# Patient Record
Sex: Female | Born: 1995 | Race: White | Hispanic: No | Marital: Single | Smoking: Never smoker
Health system: Southern US, Community
[De-identification: ages and names within clinical notes are randomized; demographics above are authoritative.]

## PROBLEM LIST (undated history)

## (undated) DIAGNOSIS — L709 Acne, unspecified: Secondary | ICD-10-CM

## (undated) DIAGNOSIS — N870 Mild cervical dysplasia: Secondary | ICD-10-CM

## (undated) DIAGNOSIS — N912 Amenorrhea, unspecified: Secondary | ICD-10-CM

## (undated) DIAGNOSIS — A64 Unspecified sexually transmitted disease: Secondary | ICD-10-CM

## (undated) DIAGNOSIS — R87612 Low grade squamous intraepithelial lesion on cytologic smear of cervix (LGSIL): Secondary | ICD-10-CM

## (undated) DIAGNOSIS — N890 Mild vaginal dysplasia: Secondary | ICD-10-CM

## (undated) DIAGNOSIS — N946 Dysmenorrhea, unspecified: Secondary | ICD-10-CM

## (undated) DIAGNOSIS — E782 Mixed hyperlipidemia: Secondary | ICD-10-CM

## (undated) HISTORY — DX: Mixed hyperlipidemia: E78.2

## (undated) HISTORY — DX: Amenorrhea, unspecified: N91.2

## (undated) HISTORY — DX: Mild cervical dysplasia: N87.0

## (undated) HISTORY — DX: Mild vaginal dysplasia: N89.0

## (undated) HISTORY — DX: Acne, unspecified: L70.9

## (undated) HISTORY — PX: WISDOM TOOTH EXTRACTION: SHX21

## (undated) HISTORY — DX: Unspecified sexually transmitted disease: A64

## (undated) HISTORY — DX: Dysmenorrhea, unspecified: N94.6

## (undated) HISTORY — DX: Low grade squamous intraepithelial lesion on cytologic smear of cervix (LGSIL): R87.612

---
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
## 2014-01-03 ENCOUNTER — Encounter: Payer: Self-pay | Admitting: Nurse Practitioner

## 2014-01-03 ENCOUNTER — Ambulatory Visit (INDEPENDENT_AMBULATORY_CARE_PROVIDER_SITE_OTHER): Payer: BC Managed Care – PPO | Admitting: Obstetrics and Gynecology

## 2014-01-03 ENCOUNTER — Encounter: Payer: Self-pay | Admitting: Obstetrics and Gynecology

## 2014-01-03 VITALS — BP 105/72 | HR 93 | Resp 16 | Ht 63.5 in | Wt 121.0 lb

## 2014-01-03 DIAGNOSIS — Z0000 Encounter for general adult medical examination without abnormal findings: Secondary | ICD-10-CM

## 2014-01-03 DIAGNOSIS — N926 Irregular menstruation, unspecified: Secondary | ICD-10-CM

## 2014-01-03 DIAGNOSIS — Z01419 Encounter for gynecological examination (general) (routine) without abnormal findings: Secondary | ICD-10-CM

## 2014-01-03 LAB — POCT URINALYSIS DIPSTICK
Bilirubin, UA: NEGATIVE
Blood, UA: NEGATIVE
Glucose, UA: NEGATIVE
KETONES UA: NEGATIVE
Leukocytes, UA: NEGATIVE
Nitrite, UA: NEGATIVE
PROTEIN UA: NEGATIVE
UROBILINOGEN UA: NEGATIVE
pH, UA: 6

## 2014-01-03 LAB — FSH/LH
FSH: 8 m[IU]/mL
LH: 7.5 m[IU]/mL

## 2014-01-03 LAB — HEMOGLOBIN, FINGERSTICK: HEMOGLOBIN, FINGERSTICK: 14.5 g/dL (ref 12.0–16.0)

## 2014-01-03 LAB — TESTOSTERONE: TESTOSTERONE: 81 ng/dL — AB (ref 15–40)

## 2014-01-03 LAB — PROLACTIN: PROLACTIN: 9.3 ng/mL

## 2014-01-03 LAB — TSH: TSH: 1.788 u[IU]/mL (ref 0.350–4.500)

## 2014-01-03 LAB — ESTRADIOL: Estradiol: 52.9 pg/mL

## 2014-01-03 MED ORDER — ETONOGESTREL-ETHINYL ESTRADIOL 0.12-0.015 MG/24HR VA RING
1.0000 | VAGINAL_RING | VAGINAL | Status: DC
Start: 2014-01-03 — End: 2014-04-04

## 2014-01-03 NOTE — Patient Instructions (Signed)

## 2014-01-03 NOTE — Progress Notes (Signed)
GYNECOLOGY VISIT  PCP: Dr. Winona LegatoKiefer  Referring provider:   HPI: 18 y.o.   Single  Caucasian  female   G0P0000 with Patient's last menstrual period was 12/25/2013.   here for   NGYN/  Irregular Menst Cycles Started birth control a couple of years ago for acne - from Dermatologist. Off for 4 -5 months.  Menses now longer, longest lasting 3 weeks. Having cramping, which is increasing. Takes ibuprofen and Midol which help help. Irregular menses the second year she was on the pills.  Occasional late start. Some midline chest hair that she bleaches, but does not remove.  No facial hair.  No headaches or nipple discharge.  Seeing Derm still and they are prescribing a local therapy.    Declines STD testing.  Not sexually active.   Hgb:  14.5 Urine:  neg  GYNECOLOGIC HISTORY: Patient's last menstrual period was 12/25/2013. Sexually active:  no Partner preference: female Contraception:   none Menopausal hormone therapy: no DES exposure:  no  Blood transfusions:  no  Sexually transmitted diseases:   no GYN Procedures:  no:                 Pap:   First time GYN visit History of abnormal pap smear:  no   OB History   Grav Para Term Preterm Abortions TAB SAB Ect Mult Living   0 0 0 0 0 0 0 0 0 0        LIFESTYLE: Exercise:        Rock wall climbing  - [employer]. Tobacco: no Alcohol:no Drug use:  no  OTHER HEALTH MAINTENANCE: Tetanus/TDap:2011 Gardisil: all three gardasils done (2010) Influenza:  no Zostavax:no  Bone density: no Colonoscopy: no  Cholesterol check: no  Family History  Problem Relation Age of Onset  . Diabetes Father   . Diabetes Paternal Grandmother     There are no active problems to display for this patient.  Past Medical History  Diagnosis Date  . Amenorrhea   . Dysmenorrhea     Past Surgical History  Procedure Laterality Date  . Wisdom tooth extraction      ALLERGIES: Review of patient's allergies indicates no known allergies.  Current  Outpatient Prescriptions  Medication Sig Dispense Refill  . Fexofenadine HCl (ALLEGRA PO) Take by mouth as needed.      . IBUPROFEN PO Take by mouth as needed.       No current facility-administered medications for this visit.     ROS:  Pertinent items are noted in HPI.  SOCIAL HISTORY:  Consulting civil engineertudent in international studies.  Will study abroad in [place] next year.   PHYSICAL EXAMINATION:    BP 105/72  Pulse 93  Resp 16  Ht 5' 3.5" (1.613 m)  Wt 121 lb (54.885 kg)  BMI 21.10 kg/m2  LMP 12/25/2013   Wt Readings from Last 3 Encounters:  01/03/14 121 lb (54.885 kg) (36%*, Z = -0.37)   * Growth percentiles are based on CDC 2-20 Years data.     Ht Readings from Last 3 Encounters:  01/03/14 5' 3.5" (1.613 m) (38%*, Z = -0.31)   * Growth percentiles are based on CDC 2-20 Years data.    General appearance: alert, cooperative and appears stated age Head: Normocephalic, without obvious abnormality, atraumatic Neck: no adenopathy, supple, symmetrical, trachea midline and thyroid not enlarged, symmetric, no tenderness/mass/nodules Lungs: clear to auscultation bilaterally Breasts: Inspection negative, No nipple retraction or dimpling, No nipple discharge or bleeding, No axillary  or supraclavicular adenopathy, Normal to palpation without dominant masses Heart: regular rate and rhythm Abdomen: soft, non-tender; no masses,  no organomegaly Extremities: extremities normal, atraumatic, no cyanosis or edema Skin: Skin color, texture, turgor normal. No rashes or lesions.  Small amount of midline chest hair.  Lymph nodes: Cervical, supraclavicular, and axillary nodes normal. No abnormal inguinal nodes palpated Neurologic: Grossly normal  Pelvic: External genitalia:  no lesions              Urethra:  normal appearing urethra with no masses, tenderness or lesions              Bartholins and Skenes: normal                 Vagina: normal appearing vagina with normal color and discharge, no  lesions              Cervix: normal appearance              Pap and high risk HPV testing done: no.            Bimanual Exam:  Uterus:  uterus is normal size, shape, consistency and nontender                                      Adnexa: normal adnexa in size, nontender and no masses                                       ASSESSMENT  Normal gynecologic exam. Irregular menses.  Probable anovulation.   PLAN  Mammogram Pap smear and high risk HPV testing not indicated. Declines STD check. Will check TSH, prolactin, LH, FSH, testosterone, estradiol. Nuva Ring Rx for 12 months.  Explained proper use, benefits and risks. Medications per Epic orders Follow up in three months.  Return annually or prn   An After Visit Summary was printed and given to the patient.

## 2014-01-21 ENCOUNTER — Ambulatory Visit (INDEPENDENT_AMBULATORY_CARE_PROVIDER_SITE_OTHER): Payer: BC Managed Care – PPO | Admitting: Obstetrics and Gynecology

## 2014-01-21 ENCOUNTER — Encounter: Payer: Self-pay | Admitting: Obstetrics and Gynecology

## 2014-01-21 VITALS — BP 105/63 | HR 76 | Resp 16 | Wt 121.0 lb

## 2014-01-21 DIAGNOSIS — E282 Polycystic ovarian syndrome: Secondary | ICD-10-CM

## 2014-01-21 LAB — COMPREHENSIVE METABOLIC PANEL
ALT: 11 U/L (ref 0–35)
AST: 14 U/L (ref 0–37)
Albumin: 4.6 g/dL (ref 3.5–5.2)
Alkaline Phosphatase: 57 U/L (ref 39–117)
BILIRUBIN TOTAL: 0.5 mg/dL (ref 0.2–1.1)
BUN: 9 mg/dL (ref 6–23)
CO2: 28 mEq/L (ref 19–32)
CREATININE: 0.69 mg/dL (ref 0.50–1.10)
Calcium: 9.6 mg/dL (ref 8.4–10.5)
Chloride: 103 mEq/L (ref 96–112)
GLUCOSE: 68 mg/dL — AB (ref 70–99)
Potassium: 4.1 mEq/L (ref 3.5–5.3)
Sodium: 141 mEq/L (ref 135–145)
Total Protein: 6.7 g/dL (ref 6.0–8.3)

## 2014-01-21 LAB — LIPID PANEL
Cholesterol: 150 mg/dL (ref 0–200)
HDL: 52 mg/dL (ref >39)
LDL CALC: 84 mg/dL (ref 0–99)
Total CHOL/HDL Ratio: 2.9 Ratio
Triglycerides: 72 mg/dL (ref <150)
VLDL: 14 mg/dL (ref 0–40)

## 2014-01-21 NOTE — Progress Notes (Signed)
Patient ID: Linda PillarMyla L Spencer, female   DOB: [birth year], 18 y.o.   MRN: [id number]  Subjective  18 y.o. Single Caucasian female  810P0000 female here for discussion of elevated testosterone level noted on lab exams for irregular menses and hirsutism. Reporting some midline chest hair and no facial hair.    Has acne and has been under treatment of dermatologist.  On NuvaRing now.  Objective  Patient presents with her mother.  Labs from 01/03/14 -  Total testosterone - 81 FSH 8.0 LH 7.5 TSH 1.788 Estradiol 52.9 Prolactin 9.3  Assessment  Likely PCOS.  Plan  Discussion with patient and mother regarding PCOS, pathophysiology, clinical picture, relationship to glucose intolerance/hyperlipidemia/endometrial hyperplasia, and treatment options.  We discussed benefits of combined contraception, spironolactone, and medications such as clomid or letrazol for ovulation induction if necessary at the time of fertility planning.  We reviewed methods of hair removal if needed such as electrolysis and laser hair removal.    Patient will treat with NuvaRing at this time.  Will do a lipid profile and complete metabolic profile today.  Questions invited and answered.  Patient will follow up in 2 - 3 months for a recheck.  25 minutes face to face time of which over 50% was spent in counseling.   After visit summary to patient.

## 2014-01-21 NOTE — Patient Instructions (Signed)
Polycystic Ovarian Syndrome Polycystic ovarian syndrome (PCOS) is a common hormonal disorder among women of reproductive age. Most women with PCOS grow many small cysts on their ovaries. PCOS can cause problems with your periods and make it difficult to get pregnant. It can also cause an increased risk of miscarriage with pregnancy. If left untreated, PCOS can lead to serious health problems, such as diabetes and heart disease. CAUSES The cause of PCOS is not fully understood, but genetics may be a factor. SIGNS AND SYMPTOMS   Infrequent or no menstrual periods.   Inability to get pregnant (infertility) because of not ovulating.   Increased growth of hair on the face, chest, stomach, back, thumbs, thighs, or toes.   Acne, oily skin, or dandruff.   Pelvic pain.   Weight gain or obesity, usually carrying extra weight around the waist.   Type 2 diabetes.   High cholesterol.   High blood pressure.   Female-pattern baldness or thinning hair.   Patches of thickened and dark brown or black skin on the neck, arms, breasts, or thighs.   Tiny excess flaps of skin (skin tags) in the armpits or neck area.   Excessive snoring and having breathing stop at times while asleep (sleep apnea).   Deepening of the voice.   Gestational diabetes when pregnant.  DIAGNOSIS  There is no single test to diagnose PCOS.   Your health care provider will:   Take a medical history.   Perform a pelvic exam.   Have ultrasonography done.   Check your female and female hormone levels.   Measure glucose or sugar levels in the blood.   Do other blood tests.   If you are producing too many female hormones, your health care provider will make sure it is from PCOS. At the physical exam, your health care provider will want to evaluate the areas of increased hair growth. Try to allow natural hair growth for a few days before the visit.   During a pelvic exam, the ovaries may be enlarged  or swollen because of the increased number of small cysts. This can be seen more easily by using vaginal ultrasonography or screening to examine the ovaries and lining of the uterus (endometrium) for cysts. The uterine lining may become thicker if you have not been having a regular period.  TREATMENT  Because there is no cure for PCOS, it needs to be managed to prevent problems. Treatments are based on your symptoms. Treatment is also based on whether you want to have a baby or whether you need contraception.  Treatment may include:   Progesterone hormone to start a menstrual period.   Birth control pills to make you have regular menstrual periods.   Medicines to make you ovulate, if you want to get pregnant.   Medicines to control your insulin.   Medicine to control your blood pressure.   Medicine and diet to control your high cholesterol and triglycerides in your blood.  Medicine to reduce excessive hair growth.  Surgery, making small holes in the ovary, to decrease the amount of female hormone production. This is done through a long, lighted tube (laparoscope) placed into the pelvis through a tiny incision in the lower abdomen.  HOME CARE INSTRUCTIONS  Only take over-the-counter or prescription medicine as directed by your health care provider.  Pay attention to the foods you eat and your activity levels. This can help reduce the effects of PCOS.  Keep your weight under control.  Eat foods that are   low in carbohydrate and high in fiber.  Exercise regularly. SEEK MEDICAL CARE IF:  Your symptoms do not get better with medicine.  You have new symptoms. Document Released: 04/01/2005 Document Revised: 09/26/2013 Document Reviewed: 05/24/2013 ExitCare Patient Information 2014 [employer].  

## 2014-01-22 ENCOUNTER — Encounter: Payer: Self-pay | Admitting: Obstetrics and Gynecology

## 2014-04-04 ENCOUNTER — Encounter: Payer: Self-pay | Admitting: Obstetrics and Gynecology

## 2014-04-04 ENCOUNTER — Ambulatory Visit (INDEPENDENT_AMBULATORY_CARE_PROVIDER_SITE_OTHER): Payer: BC Managed Care – PPO | Admitting: Obstetrics and Gynecology

## 2014-04-04 VITALS — BP 100/60 | HR 60 | Ht 63.5 in | Wt 117.0 lb

## 2014-04-04 DIAGNOSIS — Z3049 Encounter for surveillance of other contraceptives: Secondary | ICD-10-CM

## 2014-04-04 MED ORDER — ETONOGESTREL-ETHINYL ESTRADIOL 0.12-0.015 MG/24HR VA RING: 1.0000 | VAGINAL_RING | VAGINAL | Status: DC

## 2014-04-04 NOTE — Progress Notes (Signed)
Patient ID: Linda PillarMyla L Spencer, female   DOB: Dec 06, 1996, 18 y.o.   MRN: [id number] GYNECOLOGY  VISIT   HPI: 18 y.o.   Single  Caucasian  female   G0P0000 with LMP 03-11-14. here for  2 month follow up since beginning Nuvaring. Menses regular.  Asking about NuvaRing and risks of clots seen in the media and on Facebook. Never sexually active.   Going to study abroad for one year, leaving in July 2015.   GYNECOLOGIC HISTORY: LMP:03-11-14 Contraception:  Nuvaring Menopausal hormone therapy: n/a        OB History   Grav Para Term Preterm Abortions TAB SAB Ect Mult Living   0 0 0 0 0 0 0 0 0 0          There are no active problems to display for this patient.   Past Medical History  Diagnosis Date  . Amenorrhea   . Dysmenorrhea   . Acne     Past Surgical History  Procedure Laterality Date  . Wisdom tooth extraction      Current Outpatient Prescriptions  Medication Sig Dispense Refill  . etonogestrel-ethinyl estradiol (NUVARING) 0.12-0.015 MG/24HR vaginal ring Place 1 each vaginally every 28 (twenty-eight) days. Insert vaginally and leave in place for 3 consecutive weeks, then remove for 1 week.  1 each  11  . Fexofenadine HCl (ALLEGRA PO) Take by mouth as needed.      . IBUPROFEN PO Take by mouth as needed.       No current facility-administered medications for this visit.     ALLERGIES: Review of patient's allergies indicates no known allergies.  Family History  Problem Relation Age of Onset  . Diabetes Father   . Diabetes Paternal Grandmother     History   Social History  . Marital Status: Single    Spouse Name: N/A    Number of Children: N/A  . Years of Education: N/A   Occupational History  . Not on file.   Social History Main Topics  . Smoking status: Never Smoker   . Smokeless tobacco: Never Used  . Alcohol Use: No  . Drug Use: No  . Sexual Activity: No     Comment: Nuvaring   Other Topics Concern  . Not on file   Social History Narrative  . No  narrative on file    ROS:  Pertinent items are noted in HPI.  PHYSICAL EXAMINATION:    BP 100/60  Pulse 60  Ht 5' 3.5" (1.613 m)  Wt 117 lb (53.071 kg)  BMI 20.40 kg/m2  LMP 03/11/2014     General appearance: alert, cooperative and appears stated age  ASSESSMENT  PCOS - mild. Cycle control on NuvaRing.   PLAN  Refill on NuvaRing for 3 months.  I discussed risks and benefits of NuvaRing, including risks of DVT, PE, MI, and stroke. She wishes to continue.  Patient will return in 3 months for a recheck before leaving the country for one year. I will prescribe one year's worth of NuvaRing then if patient is doing well.    An After Visit Summary was printed and given to the patient.  _15_____ minutes face to face time of which over 50% was spent in counseling.

## 2014-04-04 NOTE — Patient Instructions (Signed)
I will see you in July for your visit before you leave the country!

## 2014-06-19 ENCOUNTER — Ambulatory Visit (INDEPENDENT_AMBULATORY_CARE_PROVIDER_SITE_OTHER): Payer: BC Managed Care – PPO | Admitting: Obstetrics and Gynecology

## 2014-06-19 ENCOUNTER — Encounter: Payer: Self-pay | Admitting: Obstetrics and Gynecology

## 2014-06-19 VITALS — BP 110/80 | HR 92 | Resp 16 | Ht 63.5 in | Wt 119.0 lb

## 2014-06-19 DIAGNOSIS — E282 Polycystic ovarian syndrome: Secondary | ICD-10-CM | POA: Insufficient documentation

## 2014-06-19 DIAGNOSIS — Z3049 Encounter for surveillance of other contraceptives: Secondary | ICD-10-CM

## 2014-06-19 NOTE — Progress Notes (Signed)
GYNECOLOGY  VISIT   HPI: 18 y.o.   Single  Asian  female   G0P0000 with Patient's last menstrual period was 06/05/2014.   here for   Follow up on Nuvaring Has PCOS and has had elevated testosterone and hair growth on chest area.  Testosterone 81 on 01/03/14.  Menses once a month.  Less pain and bleeding with NuvaRing. No discharge problems.  No nausea.   States no problems with acne.   Going to [place] for one year and needs a 12 month Rx for NuvaRing.   GYNECOLOGIC HISTORY: Patient's last menstrual period was 06/05/2014. Contraception:   Nuvaring Menopausal hormone therapy: N/A Sexually active:  no        OB History   Grav Para Term Preterm Abortions TAB SAB Ect Mult Living   0 0 0 0 0 0 0 0 0 0          There are no active problems to display for this patient.   Past Medical History  Diagnosis Date  . Amenorrhea   . Dysmenorrhea   . Acne     Past Surgical History  Procedure Laterality Date  . Wisdom tooth extraction      Current Outpatient Prescriptions  Medication Sig Dispense Refill  . etonogestrel-ethinyl estradiol (NUVARING) 0.12-0.015 MG/24HR vaginal ring Place 1 each vaginally every 28 (twenty-eight) days. Insert vaginally and leave in place for 3 consecutive weeks, then remove for 1 week.  3 each  0  . Fexofenadine HCl (ALLEGRA PO) Take by mouth as needed.      . IBUPROFEN PO Take by mouth as needed.       No current facility-administered medications for this visit.     ALLERGIES: Review of patient's allergies indicates no known allergies.  Family History  Problem Relation Age of Onset  . Diabetes Father   . Diabetes Paternal Grandmother     History   Social History  . Marital Status: Single    Spouse Name: N/A    Number of Children: N/A  . Years of Education: N/A   Occupational History  . Not on file.   Social History Main Topics  . Smoking status: Never Smoker   . Smokeless tobacco: Never Used  . Alcohol Use: No  . Drug Use: No  .  Sexual Activity: No     Comment: Nuvaring   Other Topics Concern  . Not on file   Social History Narrative  . No narrative on file    ROS:  Pertinent items are noted in HPI.  PHYSICAL EXAMINATION:    BP 110/80  Pulse 92  Resp 16  Ht 5' 3.5" (1.613 m)  Wt 119 lb (53.978 kg)  BMI 20.75 kg/m2  LMP 06/05/2014     General appearance: alert, cooperative and appears stated age  ASSESSMENT  Polycystic ovarian disease.  History of irregular menses.  Hirsutism.   PLAN  Continue NuvaRing.  Rx for 12 months as patient is traveling out of the country. I cautioned her to make sure that she does not take any NuvaRings with her that could expire.  She understands the need for refrigeration.  Follow up prn.    An After Visit Summary was printed and given to the patient.  __15____ minutes face to face time of which over 50% was spent in counseling.

## 2014-11-19 DIAGNOSIS — A64 Unspecified sexually transmitted disease: Secondary | ICD-10-CM

## 2014-11-19 HISTORY — DX: Unspecified sexually transmitted disease: A64

## 2014-11-20 ENCOUNTER — Encounter: Payer: Self-pay | Admitting: Obstetrics and Gynecology

## 2015-06-20 DIAGNOSIS — R87612 Low grade squamous intraepithelial lesion on cytologic smear of cervix (LGSIL): Secondary | ICD-10-CM

## 2015-06-20 HISTORY — DX: Low grade squamous intraepithelial lesion on cytologic smear of cervix (LGSIL): R87.612

## 2015-07-02 ENCOUNTER — Telehealth: Payer: Self-pay | Admitting: Obstetrics and Gynecology

## 2015-07-02 NOTE — Telephone Encounter (Signed)
Tried to reach patient to leave a message to call back to reschedule a future appointment that was cancelled by the provider.

## 2015-07-07 ENCOUNTER — Encounter: Payer: Self-pay | Admitting: Obstetrics and Gynecology

## 2015-07-07 ENCOUNTER — Ambulatory Visit (INDEPENDENT_AMBULATORY_CARE_PROVIDER_SITE_OTHER): Payer: BLUE CROSS/BLUE SHIELD | Admitting: Obstetrics and Gynecology

## 2015-07-07 ENCOUNTER — Ambulatory Visit: Payer: Self-pay | Admitting: Obstetrics and Gynecology

## 2015-07-07 VITALS — BP 110/76 | HR 110 | Resp 16 | Ht 64.0 in | Wt 126.0 lb

## 2015-07-07 DIAGNOSIS — Z0000 Encounter for general adult medical examination without abnormal findings: Secondary | ICD-10-CM | POA: Diagnosis not present

## 2015-07-07 DIAGNOSIS — Z113 Encounter for screening for infections with a predominantly sexual mode of transmission: Secondary | ICD-10-CM | POA: Diagnosis not present

## 2015-07-07 DIAGNOSIS — Z01419 Encounter for gynecological examination (general) (routine) without abnormal findings: Secondary | ICD-10-CM | POA: Diagnosis not present

## 2015-07-07 LAB — HEPATITIS C ANTIBODY: HCV AB: NEGATIVE

## 2015-07-07 LAB — HEMOGLOBIN, FINGERSTICK: Hemoglobin, fingerstick: 14.6 g/dL (ref 12.0–16.0)

## 2015-07-07 MED ORDER — NORETHIN-ETH ESTRAD-FE BIPHAS 1 MG-10 MCG / 10 MCG PO TABS: 1.0000 | ORAL_TABLET | Freq: Every day | ORAL | Status: DC

## 2015-07-07 NOTE — Patient Instructions (Signed)

## 2015-07-07 NOTE — Progress Notes (Signed)
Patient ID: Linda PillarMyla L Spencer, female   DOB: 06/30/1996, 19 y.o.   MRN: [id number] 19 y.o. G0P0000 Single Asian female here for annual exam.  Patient states diagnosed and treated for Chlamydia in 11/2014 while in [place] but never had TOC. Really worried about infectious disease and wants full STD testing.   Used NuvaRing in past.  Did not like it due to fatigue and decreased sensitivity of the skin.  Also had emotional sensitive.  Menses regular.   States decreased orgasms, especially vaginally.  Able to have clitoral orgasms. Feels this has changed.   PCP:   None  Patient's last menstrual period was 06/25/2015 (exact date).          Sexually active: Yes.  female  The current method of family planning is condoms everytime..    Exercising: Yes.    Yoga once weekly. Smoker:  no  Health Maintenance: Pap:  never History of abnormal Pap:  n/a MMG:  n/a Colonoscopy:  n/a BMD:   n/a  Result  n/a TDaP:  2011 Screening Labs:  Hb today: 14.6, Urine today: 1+WBCs, 1+RBCs--asx(non-clean catch)   reports that she has never smoked. She has never used smokeless tobacco. She reports that she drinks about 0.6 oz of alcohol per week. She reports that she does not use illicit drugs.  Past Medical History  Diagnosis Date  . Amenorrhea   . Dysmenorrhea   . Acne   . STD (sexually transmitted disease) 11/2014    Tx'd for Chlamydia while in [place]    Past Surgical History  Procedure Laterality Date  . Wisdom tooth extraction      Current Outpatient Prescriptions  Medication Sig Dispense Refill  . Fexofenadine HCl (ALLEGRA PO) Take by mouth as needed.    . IBUPROFEN PO Take by mouth as needed.     No current facility-administered medications for this visit.    Family History  Problem Relation Age of Onset  . Diabetes Father   . Diabetes Paternal Grandmother     ROS:  Pertinent items are noted in HPI.  Otherwise, a comprehensive ROS was negative.  Exam:   BP 110/76 mmHg  Pulse 110  Resp  16  Ht 5\' 4"  (1.626 m)  Wt 126 lb (57.153 kg)  BMI 21.62 kg/m2  LMP 06/25/2015 (Exact Date)    General appearance: alert, cooperative and appears stated age Head: Normocephalic, without obvious abnormality, atraumatic Neck: no adenopathy, supple, symmetrical, trachea midline and thyroid normal to inspection and palpation Lungs: clear to auscultation bilaterally Breasts: normal appearance, no masses or tenderness, Inspection negative, No nipple retraction or dimpling, No nipple discharge or bleeding, No axillary or supraclavicular adenopathy Heart: regular rate and rhythm Abdomen: soft, non-tender; bowel sounds normal; no masses,  no organomegaly Extremities: extremities normal, atraumatic, no cyanosis or edema Skin: Skin color, texture, turgor normal. No rashes or lesions Lymph nodes: Cervical, supraclavicular, and axillary nodes normal. No abnormal inguinal nodes palpated Neurologic: Grossly normal  Pelvic: External genitalia:  no lesions              Urethra:  normal appearing urethra with no masses, tenderness or lesions              Bartholins and Skenes: normal                 Vagina: normal appearing vagina with normal color and discharge, no lesions              Cervix: no lesions  Pap taken: Yes.   Bimanual Exam:  Uterus:  normal size, contour, position, consistency, mobility, non-tender              Adnexa: normal adnexa and no mass, fullness, tenderness              Rectovaginal: No..     Chaperone was present for exam.  Assessment:   Well woman visit with normal exam. Hx of chlamydia with no test of cure.  Change in sexual function.   Plan: Yearly mammogram recommended after age 66.  Recommended self breast exam.  Pap and HR HPV as above. Discussed Calcium, Vitamin D, regular exercise program including cardiovascular and weight bearing exercise. Labs performed.  Yes.  .   See orders.  STD testing done.  Recommend condom use.  Refills given on  medications.  Yes.  .  See orders.  LoLoEstrin for one year.   Instructed in use.  Discussed normal sexual functioning.   Discussed potential effect of combined oral contraceptives and antidepressants on libido. (Not taking these.) Discussed anxiety effect on sexual function as well. Discussed sexual therapy counseling if persists. Follow up annually and prn.     After visit summary provided.

## 2015-07-08 LAB — HSV(HERPES SIMPLEX VRS) I + II AB-IGG
HSV 1 Glycoprotein G Ab, IgG: <0.1 IV
HSV 2 Glycoprotein G Ab, IgG: <0.1 IV

## 2015-07-08 LAB — STD PANEL
HIV 1&2 Ab, 4th Generation: NONREACTIVE
Hepatitis B Surface Ag: NEGATIVE

## 2015-07-08 LAB — WET PREP BY MOLECULAR PROBE
Candida species: NEGATIVE
GARDNERELLA VAGINALIS: NEGATIVE
Trichomonas vaginosis: NEGATIVE

## 2015-07-09 LAB — IPS PAP TEST WITH REFLEX TO HPV

## 2015-07-10 ENCOUNTER — Telehealth: Payer: Self-pay

## 2015-07-10 ENCOUNTER — Encounter: Payer: Self-pay | Admitting: Obstetrics and Gynecology

## 2015-07-10 LAB — GC/CHLAMYDIA PROBE AMP, URINE
CHLAMYDIA, SWAB/URINE, PCR: NEGATIVE
GC PROBE AMP, URINE: NEGATIVE

## 2015-07-10 NOTE — Telephone Encounter (Signed)
Spoke with patient. Advised of results and message as seen below form Dr.Silva. Patient is agreeable and verbalizes understanding. Patient states that she has had the HPV vaccination. 08 recall entered. Next aex scheduled for 07/16/2016 with Dr.Silva. Aware we will call with results of HSV testing when this has returned.  Routing to provider for final review. Patient agreeable to disposition. Will close encounter.   Patient aware provider will review message and nurse will return call if any additional advice or change of disposition.

## 2015-07-10 NOTE — Telephone Encounter (Signed)
-----   Message from Patton Salles, MD sent at 07/10/2015  1:02 PM EDT ----- Please contact patient with test results - pap and STD testing.  Her pap showed LGSIL.  This is not cancer, but it is an abnormal pap smear.  Abnormal pap smears are cause from the human papilloma virus.  Her cervix essentially has an infection which needs time to resolve.  It usually goes away spontaneously. There is no treatment for the viral infection except a healthy lifestyle and not smoking. She will need a repeat pap in 12 months.  Please place in recall - 08.  If she has not done Gardasil vaccination, I recommend she proceed with Gardasil 9.  Remaining testing is negative for GC/CT, HIV, syphilis, hep B, hep C, and trichomonas. Final HSV testing is pending.   Cc- Marchelle Folks [facility]

## 2015-07-11 LAB — HSV 1 AND 2 IGM ABS, INDIRECT
HSV 1 IgM Abs: NEGATIVE
HSV2IGM: NEGATIVE

## 2015-08-16 ENCOUNTER — Encounter: Payer: Self-pay | Admitting: Obstetrics and Gynecology

## 2016-07-16 ENCOUNTER — Encounter: Payer: Self-pay | Admitting: Obstetrics and Gynecology

## 2016-07-16 ENCOUNTER — Ambulatory Visit (INDEPENDENT_AMBULATORY_CARE_PROVIDER_SITE_OTHER): Payer: BLUE CROSS/BLUE SHIELD | Admitting: Obstetrics and Gynecology

## 2016-07-16 VITALS — BP 110/74 | HR 88 | Resp 18 | Ht 64.25 in | Wt 136.0 lb

## 2016-07-16 DIAGNOSIS — Z01419 Encounter for gynecological examination (general) (routine) without abnormal findings: Secondary | ICD-10-CM

## 2016-07-16 DIAGNOSIS — Z0000 Encounter for general adult medical examination without abnormal findings: Secondary | ICD-10-CM

## 2016-07-16 LAB — POCT URINALYSIS DIPSTICK
BILIRUBIN UA: NEGATIVE
Glucose, UA: NEGATIVE
Ketones, UA: NEGATIVE
Leukocytes, UA: NEGATIVE
NITRITE UA: NEGATIVE
Protein, UA: NEGATIVE
RBC UA: NEGATIVE
Urobilinogen, UA: NEGATIVE
pH, UA: 5

## 2016-07-16 MED ORDER — NORETHIN ACE-ETH ESTRAD-FE 1-20 MG-MCG PO TABS: 1.0000 | ORAL_TABLET | Freq: Every day | ORAL | 3 refills | Status: DC

## 2016-07-16 NOTE — Progress Notes (Signed)
20 y.o. G0P0000 Single Asian female here for annual exam.    Just graduated.  Would like to go to [place] and work.  Stopped taking OCPs due to cost.  Was on LoLoEstrin.   Declines STD testing and blood work in general other than Hgb done.   PCP:  None  Patient's last menstrual period was 06/27/2016 (exact date).           Sexually active: Yes.   female The current method of family planning is OCP (estrogen/progesterone)--LoLoestrin/condoms.    Exercising: Yes.    yoga,rock climbing and cardio. Smoker:  no  Health Maintenance: Pap:  07-07-15 LGSIL History of abnormal Pap:  N/A MMG:  n/a Colonoscopy:  n/a BMD:   n/a  Result  n/a TDaP:  2011 Gardasil:   Yes, 2010   Screening Labs:  Hb today: 14.2, Urine today: Neg   reports that she has never smoked. She has never used smokeless tobacco. She reports that she drinks about 0.6 oz of alcohol per week . She reports that she does not use drugs.  Past Medical History:  Diagnosis Date  . Acne   . Amenorrhea   . Dysmenorrhea   . 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 STD (sexually transmitted disease) 11/2014   Tx'd for Chlamydia while in [place]    Past Surgical History:  Procedure Laterality Date  . WISDOM TOOTH EXTRACTION      Current Outpatient Prescriptions  Medication Sig Dispense Refill  . Fexofenadine HCl (ALLEGRA PO) Take by mouth as needed.    . IBUPROFEN PO Take by mouth as needed.    . Norethindrone-Ethinyl Estradiol-Fe Biphas (LO LOESTRIN FE) 1 MG-10 MCG / 10 MCG tablet Take 1 tablet by mouth daily. 3 Package 3   No current facility-administered medications for this visit.     Family History  Problem Relation Age of Onset  . Diabetes Father   . Diabetes Paternal Grandmother     ROS:  Pertinent items are noted in HPI.  Otherwise, a comprehensive ROS was negative.  Exam:   BP 110/74 (BP Location: Right Arm, Patient Position: Sitting, Cuff Size: Normal)   Pulse 88   Resp 18   Ht 5' 4.25"  (1.632 m)   Wt 136 lb (61.7 kg)   LMP 06/27/2016 (Exact Date)   BMI 23.16 kg/m     General appearance: alert, cooperative and appears stated age Head: Normocephalic, without obvious abnormality, atraumatic Neck: no adenopathy, supple, symmetrical, trachea midline and thyroid normal to inspection and palpation Lungs: clear to auscultation bilaterally Breasts: normal appearance, no masses or tenderness, No nipple retraction or dimpling, No nipple discharge or bleeding, No axillary or supraclavicular adenopathy Heart: regular rate and rhythm Abdomen: soft, non-tender; no masses, no organomegaly Extremities: extremities normal, atraumatic, no cyanosis or edema Skin: Skin color, texture, turgor normal. No rashes or lesions Lymph nodes: Cervical, supraclavicular, and axillary nodes normal. No abnormal inguinal nodes palpated Neurologic: Grossly normal  Pelvic: External genitalia:  no lesions              Urethra:  normal appearing urethra with no masses, tenderness or lesions              Bartholins and Skenes: normal                 Vagina: normal appearing vagina with normal color and discharge, no lesions              Cervix: no lesions  Pap taken: Yes.   Bimanual Exam:  Uterus:  normal size, contour, position, consistency, mobility, non-tender              Adnexa: no mass, fullness, tenderness           Chaperone was present for exam.  Assessment:   Well woman visit with normal exam. Hx LGSIL.  Hx prior chlamydia.  Plan: Yearly mammogram recommended after age 50.  Recommended self breast exam.  Pap and HR HPV as above. Discussed Calcium, Vitamin D, regular exercise program including cardiovascular and weight bearing exercise. Declines STD testing.  Rx for LoEstrin 1/20.  Instructed in use. Follow up annually and prn.       After visit summary provided.

## 2016-07-16 NOTE — Patient Instructions (Signed)
Health Maintenance, Female Adopting a healthy lifestyle and getting preventive care can go a long way to promote health and wellness. Talk with your health care provider about what schedule of regular examinations is right for you. This is a good chance for you to check in with your provider about disease prevention and staying healthy. In between checkups, there are plenty of things you can do on your own. Experts have done a lot of research about which lifestyle changes and preventive measures are most likely to keep you healthy. Ask your health care provider for more information. WEIGHT AND DIET  Eat a healthy diet  Be sure to include plenty of vegetables, fruits, low-fat dairy products, and lean protein.  Do not eat a lot of foods high in solid fats, added sugars, or salt.  Get regular exercise. This is one of the most important things you can do for your health.  Most adults should exercise for at least 150 minutes each week. The exercise should increase your heart rate and make you sweat (moderate-intensity exercise).  Most adults should also do strengthening exercises at least twice a week. This is in addition to the moderate-intensity exercise.  Maintain a healthy weight  Body mass index (BMI) is a measurement that can be used to identify possible weight problems. It estimates body fat based on height and weight. Your health care provider can help determine your BMI and help you achieve or maintain a healthy weight.  For females 20 years of age and older:   A BMI below 18.5 is considered underweight.  A BMI of 18.5 to 24.9 is normal.  A BMI of 25 to 29.9 is considered overweight.  A BMI of 30 and above is considered obese.  Watch levels of cholesterol and blood lipids  You should start having your blood tested for lipids and cholesterol at 22 years of age, then have this test every 5 years.  You may need to have your cholesterol levels checked more often if:  Your lipid  or cholesterol levels are high.  You are older than 22 years of age.  You are at high risk for heart disease.  CANCER SCREENING   Lung Cancer  Lung cancer screening is recommended for adults 55-80 years old who are at high risk for lung cancer because of a history of smoking.  A yearly low-dose CT scan of the lungs is recommended for people who:  Currently smoke.  Have quit within the past 15 years.  Have at least a 30-pack-year history of smoking. A pack year is smoking an average of one pack of cigarettes a day for 1 year.  Yearly screening should continue until it has been 15 years since you quit.  Yearly screening should stop if you develop a health problem that would prevent you from having lung cancer treatment.  Breast Cancer  Practice breast self-awareness. This means understanding how your breasts normally appear and feel.  It also means doing regular breast self-exams. Let your health care provider know about any changes, no matter how small.  If you are in your 20s or 30s, you should have a clinical breast exam (CBE) by a health care provider every 1-3 years as part of a regular health exam.  If you are 40 or older, have a CBE every year. Also consider having a breast X-ray (mammogram) every year.  If you have a family history of breast cancer, talk to your health care provider about genetic screening.  If you   are at high risk for breast cancer, talk to your health care provider about having an MRI and a mammogram every year.  Breast cancer gene (BRCA) assessment is recommended for women who have family members with BRCA-related cancers. BRCA-related cancers include:  Breast.  Ovarian.  Tubal.  Peritoneal cancers.  Results of the assessment will determine the need for genetic counseling and BRCA1 and BRCA2 testing. Cervical Cancer Your health care provider may recommend that you be screened regularly for cancer of the pelvic organs (ovaries, uterus, and  vagina). This screening involves a pelvic examination, including checking for microscopic changes to the surface of your cervix (Pap test). You may be encouraged to have this screening done every 3 years, beginning at age 21.  For women ages 30-65, health care providers may recommend pelvic exams and Pap testing every 3 years, or they may recommend the Pap and pelvic exam, combined with testing for human papilloma virus (HPV), every 5 years. Some types of HPV increase your risk of cervical cancer. Testing for HPV may also be done on women of any age with unclear Pap test results.  Other health care providers may not recommend any screening for nonpregnant women who are considered low risk for pelvic cancer and who do not have symptoms. Ask your health care provider if a screening pelvic exam is right for you.  If you have had past treatment for cervical cancer or a condition that could lead to cancer, you need Pap tests and screening for cancer for at least 20 years after your treatment. If Pap tests have been discontinued, your risk factors (such as having a new sexual partner) need to be reassessed to determine if screening should resume. Some women have medical problems that increase the chance of getting cervical cancer. In these cases, your health care provider may recommend more frequent screening and Pap tests. Colorectal Cancer  This type of cancer can be detected and often prevented.  Routine colorectal cancer screening usually begins at 22 years of age and continues through 22 years of age.  Your health care provider may recommend screening at an earlier age if you have risk factors for colon cancer.  Your health care provider may also recommend using home test kits to check for hidden blood in the stool.  A small camera at the end of a tube can be used to examine your colon directly (sigmoidoscopy or colonoscopy). This is done to check for the earliest forms of colorectal  cancer.  Routine screening usually begins at age 50.  Direct examination of the colon should be repeated every 5-10 years through 22 years of age. However, you may need to be screened more often if early forms of precancerous polyps or small growths are found. Skin Cancer  Check your skin from head to toe regularly.  Tell your health care provider about any new moles or changes in moles, especially if there is a change in a mole's shape or color.  Also tell your health care provider if you have a mole that is larger than the size of a pencil eraser.  Always use sunscreen. Apply sunscreen liberally and repeatedly throughout the day.  Protect yourself by wearing long sleeves, pants, a wide-brimmed hat, and sunglasses whenever you are outside. HEART DISEASE, DIABETES, AND HIGH BLOOD PRESSURE   High blood pressure causes heart disease and increases the risk of stroke. High blood pressure is more likely to develop in:  People who have blood pressure in the high end   of the normal range (130-139/85-89 mm Hg).  People who are overweight or obese.  People who are African American.  If you are 38-23 years of age, have your blood pressure checked every 3-5 years. If you are 61 years of age or older, have your blood pressure checked every year. You should have your blood pressure measured twice--once when you are at a hospital or clinic, and once when you are not at a hospital or clinic. Record the average of the two measurements. To check your blood pressure when you are not at a hospital or clinic, you can use:  An automated blood pressure machine at a pharmacy.  A home blood pressure monitor.  If you are between 45 years and 39 years old, ask your health care provider if you should take aspirin to prevent strokes.  Have regular diabetes screenings. This involves taking a blood sample to check your fasting blood sugar level.  If you are at a normal weight and have a low risk for diabetes,  have this test once every three years after 22 years of age.  If you are overweight and have a high risk for diabetes, consider being tested at a younger age or more often. PREVENTING INFECTION  Hepatitis B  If you have a higher risk for hepatitis B, you should be screened for this virus. You are considered at high risk for hepatitis B if:  You were born in a country where hepatitis B is common. Ask your health care provider which countries are considered high risk.  Your parents were born in a high-risk country, and you have not been immunized against hepatitis B (hepatitis B vaccine).  You have HIV or AIDS.  You use needles to inject street drugs.  You live with someone who has hepatitis B.  You have had sex with someone who has hepatitis B.  You get hemodialysis treatment.  You take certain medicines for conditions, including cancer, organ transplantation, and autoimmune conditions. Hepatitis C  Blood testing is recommended for:  Everyone born from [birth year] through 1967.  Anyone with known risk factors for hepatitis C. Sexually transmitted infections (STIs)  You should be screened for sexually transmitted infections (STIs) including gonorrhea and chlamydia if:  You are sexually active and are younger than 22 years of age.  You are older than 22 years of age and your health care provider tells you that you are at risk for this type of infection.  Your sexual activity has changed since you were last screened and you are at an increased risk for chlamydia or gonorrhea. Ask your health care provider if you are at risk.  If you do not have HIV, but are at risk, it may be recommended that you take a prescription medicine daily to prevent HIV infection. This is called pre-exposure prophylaxis (PrEP). You are considered at risk if:  You are sexually active and do not regularly use condoms or know the HIV status of your partner(s).  You take drugs by injection.  You are sexually  active with a partner who has HIV. Talk with your health care provider about whether you are at high risk of being infected with HIV. If you choose to begin PrEP, you should first be tested for HIV. You should then be tested every 3 months for as long as you are taking PrEP.  PREGNANCY   If you are premenopausal and you may become pregnant, ask your health care provider about preconception counseling.  If you may  become pregnant, take 400 to 800 micrograms (mcg) of folic acid every day.  If you want to prevent pregnancy, talk to your health care provider about birth control (contraception). OSTEOPOROSIS AND MENOPAUSE   Osteoporosis is a disease in which the bones lose minerals and strength with aging. This can result in serious bone fractures. Your risk for osteoporosis can be identified using a bone density scan.  If you are 61 years of age or older, or if you are at risk for osteoporosis and fractures, ask your health care provider if you should be screened.  Ask your health care provider whether you should take a calcium or vitamin D supplement to lower your risk for osteoporosis.  Menopause may have certain physical symptoms and risks.  Hormone replacement therapy may reduce some of these symptoms and risks. Talk to your health care provider about whether hormone replacement therapy is right for you.  HOME CARE INSTRUCTIONS   Schedule regular health, dental, and eye exams.  Stay current with your immunizations.   Do not use any tobacco products including cigarettes, chewing tobacco, or electronic cigarettes.  If you are pregnant, do not drink alcohol.  If you are breastfeeding, limit how much and how often you drink alcohol.  Limit alcohol intake to no more than 1 drink per day for nonpregnant women. One drink equals 12 ounces of beer, 5 ounces of wine, or 1 ounces of hard liquor.  Do not use street drugs.  Do not share needles.  Ask your health care provider for help if  you need support or information about quitting drugs.  Tell your health care provider if you often feel depressed.  Tell your health care provider if you have ever been abused or do not feel safe at home.   This information is not intended to replace advice given to you by your health care provider. Make sure you discuss any questions you have with your health care provider.   Document Released: 06/21/2011 Document Revised: 12/27/2014 Document Reviewed: 11/07/2013 Elsevier Interactive Patient Education [employer].

## 2016-07-19 LAB — HEMOGLOBIN, FINGERSTICK: Hemoglobin, fingerstick: 14.2 g/dL (ref 12.0–16.0)

## 2016-07-20 LAB — IPS PAP TEST WITH HPV

## 2016-07-29 ENCOUNTER — Telehealth: Payer: Self-pay

## 2016-07-29 NOTE — Telephone Encounter (Signed)
-----   Message from Patton SallesBrook E Amundson C Silva, MD sent at 07/26/2016 11:07 AM EDT ----- Please inform patient of pap showing ASCUS and positive HR HPV.  By protocol, she will need pap next year.  Her pap last year was LGSIL.  Please enter 08 recall.  If her next pap is abnormal, colposcopy will need to be done then.

## 2016-07-29 NOTE — Telephone Encounter (Signed)
Called patient at [phone] to discuss pap results, unable to leave message "mailbox full".

## 2016-07-29 NOTE — Telephone Encounter (Signed)
Patient returned call to Amanda. °

## 2016-08-02 NOTE — Telephone Encounter (Signed)
Patient notified--see result note message from 07-29-16.

## 2017-01-18 ENCOUNTER — Encounter: Payer: Self-pay | Admitting: Obstetrics and Gynecology

## 2017-01-24 ENCOUNTER — Ambulatory Visit (INDEPENDENT_AMBULATORY_CARE_PROVIDER_SITE_OTHER): Payer: 59 | Admitting: Obstetrics and Gynecology

## 2017-01-24 ENCOUNTER — Encounter: Payer: Self-pay | Admitting: Obstetrics and Gynecology

## 2017-01-24 VITALS — BP 123/62 | HR 92 | Resp 16 | Ht 64.0 in | Wt 127.0 lb

## 2017-01-24 DIAGNOSIS — Z3009 Encounter for other general counseling and advice on contraception: Secondary | ICD-10-CM | POA: Diagnosis not present

## 2017-01-24 DIAGNOSIS — R6882 Decreased libido: Secondary | ICD-10-CM

## 2017-01-24 DIAGNOSIS — Z113 Encounter for screening for infections with a predominantly sexual mode of transmission: Secondary | ICD-10-CM

## 2017-01-24 NOTE — Progress Notes (Signed)
GYNECOLOGY  VISIT   HPI: 21 y.o.   Single  Asian  female   G0P0000 with Patient's last menstrual period was 01/10/2017.   here for   Low libido/ STD testing.  Symptoms for about 4 years.   Patient is on combined oral contraceptives for many years.  Hx irregular menses 8 years ago prior to going on OCPs.  Has also used OCPs to treat acne. Was on LoLoestrin and then switched to LoEstrin 120.  Does not note a change in her libido during this time when the dosage was changed.  Feels numb with respect to sex. No pain with intercourse.   Has some cramps with menses but nothing too bad.   Has a new partner.  Wants STD check today.   Not taking any antidepressants.   Denies any sexual trauma.  Working for [employer].  GYNECOLOGIC HISTORY: Patient's last menstrual period was 01/10/2017. Contraception:  OCP - JUNEL Menopausal hormone therapy:  N/A Last mammogram:  N/A Last pap smear:   07/16/16 Pap smear showed ASCUS; positive HR HPV; pap in 2016 was LGSIL        OB History    Gravida Para Term Preterm AB Living   0 0 0 0 0 0   SAB TAB Ectopic Multiple Live Births   0 0 0 0           Patient Active Problem List   Diagnosis Date Noted  . Polycystic ovarian disease 06/19/2014    Past Medical History:  Diagnosis Date  . Acne   . Amenorrhea   . Dysmenorrhea   . 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 STD (sexually transmitted disease) 11/2014   Tx'd for [place]lamydia [place]    Past Surgical History:  Procedure Laterality Date  . WISDOM TOOTH EXTRACTION      Current Outpatient Prescriptions  Medication Sig Dispense Refill  . Fexofenadine HCl (ALLEGRA PO) Take by mouth as needed.    . IBUPROFEN PO Take by mouth as needed.    . norethindrone-ethinyl estradiol (JUNEL FE,GILDESS FE,LOESTRIN FE) 1-20 MG-MCG tablet Take 1 tablet by mouth daily. 3 Package 3   No current facility-administered medications for this visit.      ALLERGIES: Patient has  no known allergies.  Family History  Problem Relation Age of Onset  . Diabetes Father   . Diabetes Paternal Grandmother     Social History   Social History  . Marital status: Single    Spouse name: N/A  . Number of children: N/A  . Years of education: N/A   Occupational History  . Not on file.   Social History Main Topics  . Smoking status: Never Smoker  . Smokeless tobacco: Never Used  . Alcohol use 0.6 oz/week    1 Standard drinks or equivalent per week  . Drug use: No  . Sexual activity: Yes    Birth control/ protection: Condom, Pill     Comment: LoLoestrin   Other Topics Concern  . Not on file   Social History Narrative  . No narrative on file    ROS:  Pertinent items are noted in HPI.  PHYSICAL EXAMINATION:    BP 123/62 (BP Location: Right Arm, Patient Position: Sitting, Cuff Size: Normal)   Pulse 92   Resp 16   Ht 5\' 4"  (1.626 m)   Wt 127 lb (57.6 kg)   LMP 01/10/2017   BMI 21.80 kg/m     General appearance: alert,  cooperative and appears stated age     Pelvic: External genitalia:  no lesions              Urethra:  normal appearing urethra with no masses, tenderness or lesions              Bartholins and Skenes: normal                 Vagina: normal appearing vagina with normal color and discharge, no lesions              Cervix: no lesions                Bimanual Exam:  Uterus:  normal size, contour, position, consistency, mobility, non-tender              Adnexa: no mass, fullness, tenderness            Chaperone was present for exam.  ASSESSMENT  Decreased libido.  Long term use of combined oral contraceptives. Desire for STD testing.   PLAN  We discussed the effect of her pills on her desire and testosterone.  We discussed alternatives of IUD - ParaGard and progesterone IUDs.  We reviewed the risks and benefits of IUDs. She would like to have a ParaGard.  Will plan for Cytotec and paracervical block.  STD testing today.  She  understands the importance of condom use for STD prevention. We also discussed counseling for decreased libido and checking her testosterone level but will wait to see the effect of changing her contraception.   An After Visit Summary was printed and given to the patient.  __25____ minutes face to face time of which over 50% was spent in counseling.

## 2017-01-25 LAB — WET PREP BY MOLECULAR PROBE
CANDIDA SPECIES: NEGATIVE
GARDNERELLA VAGINALIS: NEGATIVE
Trichomonas vaginosis: NEGATIVE

## 2017-01-25 LAB — STD PANEL
HEP B S AG: NEGATIVE
HIV 1&2 Ab, 4th Generation: NONREACTIVE

## 2017-01-25 LAB — HEPATITIS C ANTIBODY: HCV AB: NEGATIVE

## 2017-01-26 LAB — GC/CHLAMYDIA PROBE AMP
CT Probe RNA: NOT DETECTED
GC Probe RNA: NOT DETECTED

## 2017-01-31 ENCOUNTER — Telehealth: Payer: Self-pay | Admitting: Obstetrics and Gynecology

## 2017-01-31 MED ORDER — MISOPROSTOL 200 MCG PO TABS: ORAL_TABLET | ORAL | 0 refills | Status: DC

## 2017-01-31 NOTE — Telephone Encounter (Signed)
Spoke with patient. Patient is calling to verify benefits for Paragard IUD insertion before scheduling. Advised will speak with the insurance and billing department and return call. Patient is agreeable.  Routing to [employer] and [employer] for [employer]

## 2017-01-31 NOTE — Telephone Encounter (Signed)
Patient is calling to get scheduled for an IUD insertion.

## 2017-01-31 NOTE — Telephone Encounter (Signed)
Spoke with patient. Agreeable to benefit. [facility].

## 2017-01-31 NOTE — Telephone Encounter (Signed)
Spoke with patient. Patient started her menses on 01/30/2017. Appointment for Paragard insertion scheduled for 02/04/2017 at 3 pm with Dr.Silva. Patient is agreeable to date and time. Pre procedure instructions given.  Motrin instructions given. Motrin=Advil=Ibuprofen, 800 mg one hour before appointment. Eat a meal and hydrate well before appointment. Cytotec instructions given. Place 2 tablets vaginally 6-12 hours prior to procedure. Rx for Cytotec 200 mcg #2 0RF sent to pharmacy on file.  Routing to provider for final review. Patient agreeable to disposition. Will close encounter.

## 2017-02-04 ENCOUNTER — Telehealth: Payer: Self-pay | Admitting: Obstetrics and Gynecology

## 2017-02-04 ENCOUNTER — Encounter: Payer: Self-pay | Admitting: Obstetrics and Gynecology

## 2017-02-04 ENCOUNTER — Ambulatory Visit (INDEPENDENT_AMBULATORY_CARE_PROVIDER_SITE_OTHER): Payer: 59 | Admitting: Obstetrics and Gynecology

## 2017-02-04 VITALS — BP 118/62 | HR 100 | Ht 64.0 in | Wt 127.4 lb

## 2017-02-04 DIAGNOSIS — Z30430 Encounter for insertion of intrauterine contraceptive device: Secondary | ICD-10-CM | POA: Diagnosis not present

## 2017-02-04 DIAGNOSIS — Z01812 Encounter for preprocedural laboratory examination: Secondary | ICD-10-CM | POA: Diagnosis not present

## 2017-02-04 DIAGNOSIS — Z3009 Encounter for other general counseling and advice on contraception: Secondary | ICD-10-CM

## 2017-02-04 LAB — POCT URINE PREGNANCY: Preg Test, Ur: NEGATIVE

## 2017-02-04 NOTE — Patient Instructions (Signed)

## 2017-02-04 NOTE — Telephone Encounter (Signed)
Patient will call back to schedule her Return for 4 - 5 weeks for IUD check - Dr. Edward JollySilva.

## 2017-02-04 NOTE — Progress Notes (Signed)
GYNECOLOGY  VISIT   HPI: 21 y.o.   Single  Asian  female   G0P0000 with Patient's last menstrual period was 01/30/2017 (exact date).   here for Paragard IUD insertion.   Has used combined oral contraceptives long term and has decreased libido.  This menses is a week early.  No missed pills.  Patient did take 800mg  of Ibuprofen prior to visit today and did place Cytotec pv this AM.  JWJ:XBJYNWGNPT:Negative GC/CT - neg/neg on 01/25/16.  GYNECOLOGIC HISTORY: Patient's last menstrual period was 01/30/2017 (exact date). Contraception:  OCPs--Junel Menopausal hormone therapy:  n/a Last mammogram:  n/a Last pap smear:   07-16-16 Ascus:Pos HR HPV; 07-07-15 LGSIL        OB History    Gravida Para Term Preterm AB Living   0 0 0 0 0 0   SAB TAB Ectopic Multiple Live Births   0 0 0 0           Patient Active Problem List   Diagnosis Date Noted  . Polycystic ovarian disease 06/19/2014    Past Medical History:  Diagnosis Date  . Acne   . Amenorrhea   . Dysmenorrhea   . 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 STD (sexually transmitted disease) 11/2014   [place]    Past Surgical History:  Procedure Laterality Date  . WISDOM TOOTH EXTRACTION      Current Outpatient Prescriptions  Medication Sig Dispense Refill  . Fexofenadine HCl (ALLEGRA PO) Take by mouth as needed.    . IBUPROFEN PO Take by mouth as needed.    . misoprostol (CYTOTEC) 200 MCG tablet Place 2 tablets vaginally 6-12 hours prior to procedure. 2 tablet 0  . norethindrone-ethinyl estradiol (JUNEL FE,GILDESS FE,LOESTRIN FE) 1-20 MG-MCG tablet Take 1 tablet by mouth daily. 3 Package 3   No current facility-administered medications for this visit.      ALLERGIES: Patient has no known allergies.  Family History  Problem Relation Age of Onset  . Diabetes Father   . Diabetes Paternal Grandmother     Social History   Social History  . Marital status: Single    Spouse name: N/A  .  Number of children: N/A  . Years of education: N/A   Occupational History  . Not on file.   Social History Main Topics  . Smoking status: Never Smoker  . Smokeless tobacco: Never Used  . Alcohol use 0.6 oz/week    1 Standard drinks or equivalent per week  . Drug use: No  . Sexual activity: Yes    Birth control/ protection: Condom, Pill     Comment: LoLoestrin   Other Topics Concern  . Not on file   Social History Narrative  . No narrative on file    ROS:  Pertinent items are noted in HPI.  PHYSICAL EXAMINATION:    BP 118/62 (BP Location: Right Arm, Patient Position: Sitting, Cuff Size: Normal)   Pulse 100   Ht 5\' 4"  (1.626 m)   Wt 127 lb 6.4 oz (57.8 kg)   LMP 01/30/2017 (Exact Date)   BMI 21.87 kg/m     General appearance: alert, cooperative and appears stated age  Pelvic: External genitalia:  no lesions              Urethra:  normal appearing urethra with no masses, tenderness or lesions              Bartholins and Skenes: normal  Vagina: normal appearing vagina with normal color and discharge, no lesions              Cervix: no lesions                Bimanual Exam:  Uterus:  normal size, contour, position, consistency, mobility, non-tender              Adnexa: no mass, fullness, tenderness       ParaGard IUD insertion - lot [id number], expiration 12/2022. Consent for procedure.  Speculum placed.  Sterile prep with Hibiclens.  Paracervical block with 10 cc 1% lidocaine.  Lot [id number], exp 02/21. Uterus sounded to 6.5 cm.  IUD placed without difficulty.  Strings trimmed and shown to patient.  Speculum removed.  Bimanual exam repeated and no change with exam.  Minimal EBL.  No complications.    Chaperone was present for exam.  ASSESSMENT  ParaGard IUD placement.  Decreased libido.  PLAN  Instructions and precautions given.  Motrin 800 mg po q 8 hrs prn.  ParaGard IUD card to patient.  Follow up in 4 - 5 weeks.  OK to stop combined oral  contraceptives.  An After Visit Summary was printed and given to the patient.

## 2017-02-14 ENCOUNTER — Encounter: Payer: Self-pay | Admitting: Obstetrics and Gynecology

## 2017-02-16 NOTE — Progress Notes (Signed)
GYNECOLOGY  VISIT   HPI: 21 y.o.   Single  Asian  female   G0P0000 with Patient's last menstrual period was 01/30/2017 (exact date).   here for vaginal discharge - watery, grey and yellow in color per patient. Vaginal odor. Really bothered by the odor and wants treatment today.   Symptoms for about 3 days.  Some cramping.  No itching or burning.  Probiotics not helping.   Had negative Affirm and GC/CT.  No abx.   No change in sexual partner.   No dyspareunia.   GYNECOLOGIC HISTORY: Patient's last menstrual period was 01/30/2017 (exact date). Contraception:  Paragard IUD inserted 02-04-17 Menopausal hormone therapy:  n/a Last mammogram:  n/a Last pap smear:   07-06-16 Ascus:Pos HR HPV;07-07-15 LGSIL        OB History    Gravida Para Term Preterm AB Living   0 0 0 0 0 0   SAB TAB Ectopic Multiple Live Births   0 0 0 0           Patient Active Problem List   Diagnosis Date Noted  . Polycystic ovarian disease 06/19/2014    Past Medical History:  Diagnosis Date  . Acne   . Amenorrhea   . Dysmenorrhea   . 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 STD (sexually transmitted disease) 11/2014   Tx'd for Chlamydia while in [place]    Past Surgical History:  Procedure Laterality Date  . WISDOM TOOTH EXTRACTION      Current Outpatient Prescriptions  Medication Sig Dispense Refill  . Fexofenadine HCl (ALLEGRA PO) Take by mouth as needed.    . IBUPROFEN PO Take by mouth as needed.    Marland Kitchen PARAGARD INTRAUTERINE COPPER IU by Intrauterine route.     No current facility-administered medications for this visit.      ALLERGIES: Patient has no known allergies.  Family History  Problem Relation Age of Onset  . Diabetes Father   . Diabetes Paternal Grandmother     Social History   Social History  . Marital status: Single    Spouse name: N/A  . Number of children: N/A  . Years of education: N/A   Occupational History  . Not on file.   Social History Main  Topics  . Smoking status: Never Smoker  . Smokeless tobacco: Never Used  . Alcohol use 0.6 oz/week    1 Standard drinks or equivalent per week  . Drug use: No  . Sexual activity: Yes    Birth control/ protection: Condom, Pill     Comment: LoLoestrin   Other Topics Concern  . Not on file   Social History Narrative  . No narrative on file    ROS:  Pertinent items are noted in HPI.  PHYSICAL EXAMINATION:    BP 100/60 (BP Location: Right Arm, Patient Position: Sitting, Cuff Size: Normal)   Pulse 88   Resp 16   Ht 5\' 4"  (1.626 m)   Wt 128 lb (58.1 kg)   LMP 01/30/2017 (Exact Date)   BMI 21.97 kg/m     General appearance: alert, cooperative and appears stated age  Pelvic: External genitalia:  no lesions              Urethra:  normal appearing urethra with no masses, tenderness or lesions              Bartholins and Skenes: normal  Vagina: normal appearing vagina with normal color and discharge, no lesions              Cervix: no lesions.  IUD strings seen.  Large amount of clear mucous mixed with some yellow drainage.                 Bimanual Exam:  Uterus:  normal size, contour, position, consistency, mobility, non-tender              Adnexa: no mass, fullness, tenderness           Chaperone was present for exam.  ASSESSMENT  Vaginitis.  IUD check up.  PLAN  Affirm done.  Discussion of vaginitis.  Will start Flagyl 500 mg po bid x 7 days.  ETOH precautions given.  Follow up for annual exam and prn.    An After Visit Summary was printed and given to the patient.  _15_____ minutes face to face time of which over 50% was spent in counseling.

## 2017-02-17 ENCOUNTER — Ambulatory Visit (INDEPENDENT_AMBULATORY_CARE_PROVIDER_SITE_OTHER): Payer: 59 | Admitting: Obstetrics and Gynecology

## 2017-02-17 ENCOUNTER — Encounter: Payer: Self-pay | Admitting: Obstetrics and Gynecology

## 2017-02-17 VITALS — BP 100/60 | HR 88 | Resp 16 | Ht 64.0 in | Wt 128.0 lb

## 2017-02-17 DIAGNOSIS — N760 Acute vaginitis: Secondary | ICD-10-CM

## 2017-02-17 DIAGNOSIS — Z30431 Encounter for routine checking of intrauterine contraceptive device: Secondary | ICD-10-CM

## 2017-02-17 MED ORDER — METRONIDAZOLE 500 MG PO TABS: 500.0000 mg | ORAL_TABLET | Freq: Two times a day (BID) | ORAL | 0 refills | Status: DC

## 2017-02-18 LAB — WET PREP BY MOLECULAR PROBE
Candida species: NOT DETECTED
Gardnerella vaginalis: DETECTED — AB
TRICHOMONAS VAG: NOT DETECTED

## 2017-08-10 ENCOUNTER — Ambulatory Visit (INDEPENDENT_AMBULATORY_CARE_PROVIDER_SITE_OTHER): Payer: 59 | Admitting: Obstetrics and Gynecology

## 2017-08-10 ENCOUNTER — Encounter: Payer: Self-pay | Admitting: Obstetrics and Gynecology

## 2017-08-10 ENCOUNTER — Other Ambulatory Visit (HOSPITAL_COMMUNITY)
Admission: RE | Admit: 2017-08-10 | Discharge: 2017-08-10 | Disposition: A | Discharge status: Home / Self Care | Payer: 59 | Source: Ambulatory Visit | Attending: Obstetrics and Gynecology | Admitting: Obstetrics and Gynecology

## 2017-08-10 VITALS — BP 110/68 | HR 60 | Ht 63.5 in | Wt 123.0 lb

## 2017-08-10 DIAGNOSIS — B373 Candidiasis of vulva and vagina: Secondary | ICD-10-CM | POA: Diagnosis not present

## 2017-08-10 DIAGNOSIS — Z113 Encounter for screening for infections with a predominantly sexual mode of transmission: Secondary | ICD-10-CM | POA: Diagnosis not present

## 2017-08-10 DIAGNOSIS — B9689 Other specified bacterial agents as the cause of diseases classified elsewhere: Secondary | ICD-10-CM | POA: Insufficient documentation

## 2017-08-10 DIAGNOSIS — Z01419 Encounter for gynecological examination (general) (routine) without abnormal findings: Secondary | ICD-10-CM | POA: Diagnosis present

## 2017-08-10 DIAGNOSIS — N870 Mild cervical dysplasia: Secondary | ICD-10-CM | POA: Diagnosis not present

## 2017-08-10 DIAGNOSIS — R6882 Decreased libido: Secondary | ICD-10-CM

## 2017-08-10 NOTE — Progress Notes (Signed)
21 y.o. G0P0000 Single Asian female here for annual exam.    Decreased libido.  Testosterone level 81 01/03/14.  Doing acupuncture which is helping somewhat.  Wants tx.  Stopped combined oral contraception and symptoms persist.  Went to [place] to see her boyfriend.   Wants STD testing.   Menses are heavier but shorter with ParaGard.  Less cramping.  Reports discharge.   PCP:   None  Patient's last menstrual period was 07/25/2017 (exact date).           Sexually active: Yes.    The current method of family planning is IUD--Paragard inserted 02-04-17.   Uses condoms also. Exercising: Yes.    Home exercise routine includes yoga and rock climbing.. Smoker:  no  Health Maintenance: Pap: 07-16-16 ASCUS:Pos HR HPV;07-07-15 LGSIL History of abnormal Pap:  Yes,  07-16-16 ASCUS:Pos HR HPV;07-07-15 LGSIL--no colposcopy MMG:  N/A Colonoscopy:  N/A BMD:   N/A  Result  N/A TDaP:  2013 Gardasil:   yes HIV: 01-24-17 Neg Hep C: 01-24-17 Neg Screening Labs:  Hb today: not collected, Urine today: not collected   reports that she has never smoked. She has never used smokeless tobacco. She reports that she drinks about 0.6 oz of alcohol per week . She reports that she does not use drugs.  Past Medical History:  Diagnosis Date  . Acne   . Amenorrhea   . Dysmenorrhea   . 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 STD (sexually transmitted disease) 11/2014   Tx'd for [place]lamydia while in [place]    Past Surgical History:  Procedure Laterality Date  . WISDOM TOOTH EXTRACTION      Current Outpatient Prescriptions  Medication Sig Dispense Refill  . Fexofenadine HCl (ALLEGRA PO) Take by mouth as needed.    . IBUPROFEN PO Take by mouth as needed.    Marland Kitchen PARAGARD INTRAUTERINE COPPER IU by Intrauterine route.     No current facility-administered medications for this visit.     Family History  Problem Relation Age of Onset  . Diabetes Father   . Diabetes Paternal Grandmother      ROS:  Pertinent items are noted in HPI.  Otherwise, a comprehensive ROS was negative.  Exam:   BP 110/68 (BP Location: Right Arm, Patient Position: Sitting, Cuff Size: Normal)   Pulse 60   Ht 5' 3.5" (1.613 m)   Wt 123 lb (55.8 kg)   LMP 07/25/2017 (Exact Date)   BMI 21.45 kg/m     General appearance: alert, cooperative and appears stated age Head: Normocephalic, without obvious abnormality, atraumatic Neck: no adenopathy, supple, symmetrical, trachea midline and thyroid normal to inspection and palpation Lungs: clear to auscultation bilaterally Breasts: normal appearance, no masses or tenderness, No nipple retraction or dimpling, No nipple discharge or bleeding, No axillary or supraclavicular adenopathy Heart: regular rate and rhythm Abdomen: soft, non-tender; no masses, no organomegaly Extremities: extremities normal, atraumatic, no cyanosis or edema Skin: Skin color, texture, turgor normal. No rashes or lesions Lymph nodes: Cervical, supraclavicular, and axillary nodes normal. No abnormal inguinal nodes palpated Neurologic: Grossly normal  Pelvic: External genitalia:  no lesions              Urethra:  normal appearing urethra with no masses, tenderness or lesions              Bartholins and Skenes: normal                 Vagina: normal appearing vagina  with normal color and discharge, no lesions              Cervix: no lesions.  IUD strings noted.  Large amount of clear yellow mucous.              Pap taken: Yes.   Bimanual Exam:  Uterus:  normal size, contour, position, consistency, mobility, non-tender              Adnexa: no mass, fullness, tenderness                 Chaperone was present for exam.  Assessment:   Well woman visit with normal exam. ParaGard IUD.  Hx LGSIL. Decreased libido.  Plan: Mammogram screening discussed. Recommended self breast awareness. Pap and HR HPV as above. Guidelines for Calcium, Vitamin D, regular exercise program including  cardiovascular and weight bearing exercise. STD screening, vaginitis panel from pap, and check testosterone level. We discussed testosterone therapy, risks and benefits, how to apply and follow up 6 weeks after initiating therapy.  Will wait for levels to prescribe. Follow up annually and prn.     After visit summary provided.

## 2017-08-10 NOTE — Patient Instructions (Signed)

## 2017-08-12 LAB — CYTOLOGY - PAP
BACTERIAL VAGINITIS: POSITIVE — AB
CANDIDA VAGINITIS: POSITIVE — AB
CHLAMYDIA, DNA PROBE: NEGATIVE
Neisseria Gonorrhea: NEGATIVE
Trichomonas: NEGATIVE

## 2017-08-14 LAB — TESTOSTERONE, FREE, DIRECT
TESTOSTERONE, TOTAL: 57.8 ng/dL — AB (ref 10.0–55.0)
Testosterone, Free: 5.5 pg/mL — ABNORMAL HIGH (ref 0.0–4.2)

## 2017-08-14 LAB — HEP, RPR, HIV PANEL
HIV Screen 4th Generation wRfx: NONREACTIVE
Hepatitis B Surface Ag: NEGATIVE
RPR Ser Ql: NONREACTIVE

## 2017-08-14 LAB — HEPATITIS C ANTIBODY

## 2017-08-16 ENCOUNTER — Telehealth: Payer: Self-pay | Admitting: *Deleted

## 2017-08-16 DIAGNOSIS — R87612 Low grade squamous intraepithelial lesion on cytologic smear of cervix (LGSIL): Secondary | ICD-10-CM

## 2017-08-16 NOTE — Telephone Encounter (Signed)
Notes recorded by Leda Min, RN on 08/16/2017 at 11:58 AM EDT Left message to call Noreene Larsson at [phone]. See telephone encounter dated 08/16/17.  ------  Notes recorded by Patton Salles, MD on 08/15/2017 at 1:44 PM EDT Please report results to the patient.  She has an abnormal pap showing LGSIL.  This is not cancer. By ASCCP protocol, it is now time to do a colposcopy for further evaluation.  Please schedule with me.  Ok to wait until September.  Her pap is showing bacterial vaginosis and yeast infection.  For the BV, she may treat with Flagyl 500 mg po bid for 7 days or Metrogel pv at hs for 5 nights.  ETOH precautions.  For the yeast, I am recommending Diflucan 150 mg po x 1. May repeat in 72 hours if needed.  Please sent to pharmacy of choice.   STD testing is negative for HIV, syphilis, hep B and C, trichomonas, gonorrhea, and chlamydia.   Her testosterone level is actually slightly elevated, so I do not recommend testosterone treatment for decreased libido.   Cc- Claudette Laws

## 2017-08-17 NOTE — Telephone Encounter (Signed)
Patient returning your call.

## 2017-08-19 MED ORDER — METRONIDAZOLE 500 MG PO TABS: 500.0000 mg | ORAL_TABLET | Freq: Two times a day (BID) | ORAL | 0 refills | Status: DC

## 2017-08-19 MED ORDER — FLUCONAZOLE 150 MG PO TABS: ORAL_TABLET | ORAL | 0 refills | Status: DC

## 2017-08-19 NOTE — Telephone Encounter (Signed)
Patient returning your call.

## 2017-08-19 NOTE — Telephone Encounter (Signed)
Please contact patient with results and schedule colposcopy with me.

## 2017-08-19 NOTE — Telephone Encounter (Signed)
Spoke with patient, advised of results and recommendations as seen below per Dr. Edward JollySilva. Paragard IUD for contraception. LMP 08/18/17. Brief explanation of procedure provided, questions answered. Advised to take Motrin 800 mg with food and water one hour before procedure. Scheduled for colpo on 09/08/17 at 3pm with Dr. Edward JollySilva. Rx for flagyl po and diflucan to verified pharmacy. Patient verbalizes understanding and is agreeable.  Order placed for colposcopy.  Patient is agreeable to disposition. Will close encounter.  Cc: Harland DingwallSuzy Dixon

## 2017-09-07 NOTE — Progress Notes (Signed)
Subjective:     Patient ID: Linda Spencer, female   DOB: Aug 21, 1996, 21 y.o.   MRN: [id number]  HPI  Patient here today for colposcopy. Pap smear 08-10-17 LGSIL.  Pap history:  08-10-17 LGSIL  07-16-16 ASCUS:Pos HR HPV  07-07-15 LGSIL--no colposcopy.  Treated for BV and Candida prior to colposcopy today.   Did Gardasil series.    Review of Systems  LMP: 08-22-17 Contraception: Paragard IUD inserted 02-04-17 and condoms.  UPT: Neg    Objective:   Physical Exam Colposcopy - Rim of acetowhite change around the large cervical transformation zone.   ParaGard IUD strings noted.  3% acetic acid used.  White and green filter light used.  Satisfactory colposcopy.  ECC, biopsies of cervix at 6, 9, and 1 o'clock - all to path separately.  Monsel's placed.  Minimal EBL.  No complications.     Assessment:     LGSIL pap.     Plan:        Follow up biopsies.  Discussed paps, dysplasia, HPV, colposcopy.  At least follow up pap in one year anticipated.  ___15____ minutes face to face time of which over 50% was spent in counseling.   After visit summary to patient.

## 2017-09-08 ENCOUNTER — Ambulatory Visit (INDEPENDENT_AMBULATORY_CARE_PROVIDER_SITE_OTHER): Payer: 59 | Admitting: Obstetrics and Gynecology

## 2017-09-08 ENCOUNTER — Encounter: Payer: Self-pay | Admitting: Obstetrics and Gynecology

## 2017-09-08 VITALS — BP 120/62 | HR 116 | Resp 16 | Ht 64.0 in | Wt 123.0 lb

## 2017-09-08 DIAGNOSIS — R87612 Low grade squamous intraepithelial lesion on cytologic smear of cervix (LGSIL): Secondary | ICD-10-CM

## 2017-09-08 DIAGNOSIS — Z01812 Encounter for preprocedural laboratory examination: Secondary | ICD-10-CM

## 2017-09-08 LAB — POCT URINE PREGNANCY: Preg Test, Ur: NEGATIVE

## 2017-09-08 NOTE — Patient Instructions (Signed)
Colposcopy, Care After  This sheet gives you information about how to care for yourself after your procedure. Your doctor may also give you more specific instructions. If you have problems or questions, contact your doctor.  What can I expect after the procedure?  If you did not have a tissue sample removed (did not have a biopsy), you may only have some spotting for a few days. You can go back to your normal activities.  If you had a tissue sample removed, it is common to have:  · Soreness and pain. This may last for a few days.  · Light-headedness.  · Mild bleeding from your vagina or dark-colored, grainy discharge from your vagina. This may last for a few days. You may need to wear a sanitary pad.  · Spotting for at least 48 hours after the procedure.    Follow these instructions at home:  · Take over-the-counter and prescription medicines only as told by your doctor. Ask your doctor what medicines you can start taking again. This is very important if you take blood-thinning medicine.  · Do not drive or use heavy machinery while taking prescription pain medicine.  · For 3 days, or as long as your doctor tells you, avoid:  ? Douching.  ? Using tampons.  ? Having sex.  · If you use birth control (contraception), keep using it.  · Limit activity for the first day after the procedure. Ask your doctor what activities are safe for you.  · It is up to you to get the results of your procedure. Ask your doctor when your results will be ready.  · Keep all follow-up visits as told by your doctor. This is important.  Contact a doctor if:  · You get a skin rash.  Get help right away if:  · You are bleeding a lot from your vagina. It is a lot of bleeding if you are using more than one pad an hour for 2 hours in a row.  · You have clumps of blood (blood clots) coming from your vagina.  · You have a fever.  · You have chills  · You have pain in your lower belly (pelvic area).  · You have signs of infection, such as vaginal  discharge that is:  ? Different than usual.  ? Yellow.  ? Bad-smelling.  · You have very pain or cramps in your lower belly that do not get better with medicine.  · You feel light-headed.  · You feel dizzy.  · You pass out (faint).  Summary  · If you did not have a tissue sample removed (did not have a biopsy), you may only have some spotting for a few days. You can go back to your normal activities.  · If you had a tissue sample removed, it is common to have mild pain and spotting for 48 hours.  · For 3 days, or as long as your doctor tells you, avoid douching, using tampons and having sex.  · Get help right away if you have bleeding, very bad pain, or signs of infection.  This information is not intended to replace advice given to you by your health care provider. Make sure you discuss any questions you have with your health care provider.  Document Released: 05/24/2008 Document Revised: 08/25/2016 Document Reviewed: 08/25/2016  Elsevier Interactive Patient Education © 2018 [employer]

## 2017-09-14 ENCOUNTER — Telehealth: Payer: Self-pay | Admitting: *Deleted

## 2017-09-14 NOTE — Telephone Encounter (Signed)
ASCCP guidelines are to do another colposcopy if the pap is abnormal next year.  Treatment is not recommended for LGSIL, especially in young woman.   LGSIL usually resolves on its own but may take years to develop immunity to the virus and have the cells normalize. LEEP procedures to remove abnormal cells can cause scarring of the cervix and increase risk of preterm labor and delivery.  LEEP is usually done if someone has HGSIL (moderate or high grade dysplasia), where the benefit out weighs the risk of the procedure. I hope this helps.  If she has further questions and wants to come in for a consultation, I welcome this.

## 2017-09-14 NOTE — Telephone Encounter (Signed)
Left message to call Cynthia Stainback at [phone].  

## 2017-09-14 NOTE — Telephone Encounter (Signed)
Patient returning your call.

## 2017-09-14 NOTE — Telephone Encounter (Signed)
Notes recorded by Leda Min, RN on 09/14/2017 at 11:10 AM EDT Left message to call Linda Spencer at [phone]. See telephone encounter dated 09/14/17.   ------  Notes recorded by Patton Salles, MD on 09/13/2017 at 2:16 PM EDT Please report results of colposcopy showing LGSIL.  No cancer was seen.  Treatment is not indicated.  By protocol, she is due to have a pap in one year.  Please place in 08 recall.

## 2017-09-14 NOTE — Telephone Encounter (Signed)
Spoke with patient, advised as seen below per Dr. Edward Jolly. Patient scheduled for AEX on 08/16/18 at 10:30am.   Patient asking if pap is abnormal at next AEX what will recommendations be? Advised patient would review with Dr. Edward Jolly and return call with recommendations.  Patient verbalizes understanding and is agreeable.  08 recall placed.   Dr. Edward Jolly -please review and advise?

## 2017-09-20 NOTE — Telephone Encounter (Signed)
Spoke with patient, advised as seen below per Dr. Edward Jolly. Patient verbalizes understanding and is agreeable. Patient thankful for further explanation, declines OV at this time.  Patient is agreeable to disposition. Will close encounter.

## 2017-10-22 ENCOUNTER — Encounter (HOSPITAL_BASED_OUTPATIENT_CLINIC_OR_DEPARTMENT_OTHER): Payer: Self-pay | Admitting: *Deleted

## 2017-10-22 ENCOUNTER — Emergency Department (HOSPITAL_BASED_OUTPATIENT_CLINIC_OR_DEPARTMENT_OTHER)
Admission: EM | Admit: 2017-10-22 | Discharge: 2017-10-22 | Disposition: A | Discharge status: Home / Self Care | Payer: 59 | Attending: Emergency Medicine | Admitting: Emergency Medicine

## 2017-10-22 DIAGNOSIS — B09 Unspecified viral infection characterized by skin and mucous membrane lesions: Secondary | ICD-10-CM

## 2017-10-22 DIAGNOSIS — J029 Acute pharyngitis, unspecified: Secondary | ICD-10-CM | POA: Insufficient documentation

## 2017-10-22 DIAGNOSIS — B084 Enteroviral vesicular stomatitis with exanthem: Secondary | ICD-10-CM | POA: Diagnosis not present

## 2017-10-22 DIAGNOSIS — R21 Rash and other nonspecific skin eruption: Secondary | ICD-10-CM | POA: Diagnosis present

## 2017-10-22 DIAGNOSIS — Z79899 Other long term (current) drug therapy: Secondary | ICD-10-CM | POA: Insufficient documentation

## 2017-10-22 DIAGNOSIS — R599 Enlarged lymph nodes, unspecified: Secondary | ICD-10-CM | POA: Diagnosis not present

## 2017-10-22 DIAGNOSIS — R591 Generalized enlarged lymph nodes: Secondary | ICD-10-CM

## 2017-10-22 LAB — RAPID STREP SCREEN (MED CTR MEBANE ONLY): Streptococcus, Group A Screen (Direct): NEGATIVE

## 2017-10-22 MED ORDER — PREDNISONE 50 MG PO TABS: 50.0000 mg | ORAL_TABLET | Freq: Every day | ORAL | 0 refills | Status: DC

## 2017-10-22 NOTE — ED Triage Notes (Signed)
Left sided lymph node swelling x >1 week. Went to clinic at work and was rx clindamycin x 7days.  After pt completed antibiotic, broke out in a generazlied body rash. Lymph nodes remain swollen.

## 2017-10-22 NOTE — Discharge Instructions (Signed)
We did draw some cultures and if they are positive we will call you with these results.  Follow-up with your primary doctor.

## 2017-10-23 NOTE — ED Provider Notes (Signed)
[facility] EMERGENCY DEPARTMENT Provider Note   CSN: [id number] Arrival date & time: 10/22/17  1539     History   Chief Complaint Chief Complaint  Patient presents with  . Adenopathy    HPI Linda Spencer is a 21 y.o. female.  HPI Patient presents to the emergency department with diffuse rash that involves palms and soles states that she recently had a sore throat about a week.  She states that she was seen a week ago for continued swollen lymph node.  He was seen in urgent care and sent to the emergency department for further evaluation of this.  Patient states the rash started 2 days ago.  The patient denies chest pain, shortness of breath, headache,blurred vision, neck pain, fever, cough, weakness, numbness, dizziness, anorexia, edema, abdominal pain, nausea, vomiting, diarrhea, rash, back pain, dysuria, hematemesis, bloody stool, near syncope, or syncope. Past Medical History:  Diagnosis Date  . Acne   . Amenorrhea   . Dysmenorrhea   . 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 STD (sexually transmitted disease) 11/2014   Tx'd for Chlamydia while in [place]    Patient Active Problem List   Diagnosis Date Noted  . Polycystic ovarian disease 06/19/2014    Past Surgical History:  Procedure Laterality Date  . WISDOM TOOTH EXTRACTION      OB History    Gravida Para Term Preterm AB Living   0 0 0 0 0 0   SAB TAB Ectopic Multiple Live Births   0 0 0 0 0       Home Medications    Prior to Admission medications   Medication Sig Start Date End Date Taking? Authorizing Provider  fexofenadine (ALLEGRA) 30 MG tablet Take 30 mg by mouth 2 (two) times daily.   Yes [provider]  IBUPROFEN PO Take by mouth as needed.    [provider]  PARAGARD INTRAUTERINE COPPER IU by Intrauterine route.    [provider]  predniSONE (DELTASONE) 50 MG tablet Take 1 tablet (50 mg total) by mouth daily. 10/22/17   Charlestine Night, PA-C     Family History Family History  Problem Relation Age of Onset  . Diabetes Father   . Diabetes Paternal Grandmother     Social History Social History   Tobacco Use  . Smoking status: Never Smoker  . Smokeless tobacco: Never Used  Substance Use Topics  . Alcohol use: Yes    Alcohol/week: 0.6 oz    Types: 1 Standard drinks or equivalent per week  . Drug use: No     Allergies   Patient has no known allergies.   Review of Systems Review of Systems All other systems negative except as documented in the HPI. All pertinent positives and negatives as reviewed in the HPI. Physical Exam Updated Vital Signs BP 117/75 (BP Location: Right Arm)   Pulse 91   Temp 98.3 F (36.8 C) (Oral)   Resp 16   Ht 5\' 4"  (1.626 m)   Wt 59 kg (130 lb)   SpO2 100%   BMI 22.31 kg/m   Physical Exam  Constitutional: She is oriented to person, place, and time. She appears well-developed and well-nourished. No distress.  HENT:  Head: Normocephalic and atraumatic.  Mouth/Throat: Oropharynx is clear and moist.  Eyes: Pupils are equal, round, and reactive to light.  Neck: Normal range of motion. Neck supple.  Cardiovascular: Normal rate, regular rhythm and normal heart sounds. Exam reveals no gallop  and no friction rub.  No murmur heard. Pulmonary/Chest: Effort normal and breath sounds normal. No respiratory distress. She has no wheezes.  Abdominal: Soft. Bowel sounds are normal. She exhibits no distension. There is no tenderness.  Neurological: She is alert and oriented to person, place, and time. She exhibits normal muscle tone. Coordination normal.  Skin: Skin is warm and dry. Capillary refill takes less than 2 seconds. Rash noted. No erythema.  Patient does have a rash that involves the palms and a few on the soles of her feet. the rash is diffuse  Psychiatric: She has a normal mood and affect. Her behavior is normal.  Nursing note and vitals reviewed.    ED Treatments / Results   Labs (all labs ordered are listed, but only abnormal results are displayed) Labs Reviewed  RAPID STREP SCREEN (NOT [facility])  CULTURE, GROUP A STREP ([facility])  RPR    EKG  EKG Interpretation None       Radiology No results found.  Procedures Procedures (including critical care time)  Medications Ordered in ED Medications - No data to display   Initial Impression / Assessment and Plan / ED Course  I have reviewed the triage vital signs and the nursing notes.  Pertinent labs & imaging results that were available during my care of the patient were reviewed by me and considered in my medical decision making (see chart for details).    I did feel that we need to check the patient for syphilis based on the rash involves her palms and soles and she also has adenopathy along with sore throat. she states that the rash does not itch  Final Clinical Impressions(s) / ED Diagnoses   Final diagnoses:  Viral rash  Hand, foot and mouth disease  Rash  Adenopathy    ED Discharge Orders        Ordered    predniSONE (DELTASONE) 50 MG tablet  Daily     10/22/17 1812       Charlestine NightLawyer, Tanette Chauca, PA-C 10/23/17 2317    Cathren LaineSteinl, Kevin, MD 10/23/17 [phone]

## 2017-10-24 LAB — RPR: RPR Ser Ql: NONREACTIVE

## 2017-10-25 LAB — CULTURE, GROUP A STREP (THRC)

## 2017-12-01 ENCOUNTER — Other Ambulatory Visit: Payer: Self-pay

## 2017-12-01 ENCOUNTER — Encounter: Payer: Self-pay | Admitting: Obstetrics & Gynecology

## 2017-12-01 ENCOUNTER — Ambulatory Visit (INDEPENDENT_AMBULATORY_CARE_PROVIDER_SITE_OTHER): Payer: 59 | Admitting: Obstetrics & Gynecology

## 2017-12-01 VITALS — Ht 63.5 in

## 2017-12-01 DIAGNOSIS — N898 Other specified noninflammatory disorders of vagina: Secondary | ICD-10-CM

## 2017-12-01 DIAGNOSIS — N760 Acute vaginitis: Secondary | ICD-10-CM | POA: Diagnosis not present

## 2017-12-01 MED ORDER — TINIDAZOLE 500 MG PO TABS: 1.0000 g | ORAL_TABLET | Freq: Every day | ORAL | 0 refills | Status: DC

## 2017-12-01 NOTE — Progress Notes (Addendum)
GYNECOLOGY  VISIT  CC:   Vaginal discharge  HPI: 21 y.o. G0P0000 Single Asian female here for vaginitis symptoms x 2 weeks.  Reports she is having discharge.  Denies vaginal odor.  Denies back pain or fever.  Denies urinary symptoms.  SA.  Has ParaGuard.  Cycles last for about 7 days.    Had STD testing in the summer.  Together with partner about a year.  Declines today.  Did have LGSIL pap smear 08/10/17 and had colposcopy with Dr Edward JollySilva.  GYNECOLOGIC HISTORY: Patient's last menstrual period was 11/11/2017. Contraception: ParaGard  Menopausal hormone therapy: none  Patient Active Problem List   Diagnosis Date Noted  . Polycystic ovarian disease 06/19/2014    Past Medical History:  Diagnosis Date  . Acne   . Amenorrhea   . Dysmenorrhea   . 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 STD (sexually transmitted disease) 11/2014   [place] for [place]lam[place]    Past Surgical History:  Procedure Laterality Date  . WISDOM TOOTH EXTRACTION      MEDS:   Current Outpatient Medications on File Prior to Visit  Medication Sig Dispense Refill  . fexofenadine (ALLEGRA) 30 MG tablet Take 30 mg by mouth 2 (two) times daily.    . IBUPROFEN PO Take by mouth as needed.    Marland Kitchen. PARAGARD INTRAUTERINE COPPER IU by Intrauterine route.     No current facility-administered medications on file prior to visit.     ALLERGIES: Mango flavor  Family History  Problem Relation Age of Onset  . Diabetes Father   . Diabetes Paternal Grandmother     SH:  Single, non smoker  Review of Systems  Genitourinary:       Vaginal discharge    PHYSICAL EXAMINATION:    Ht 5' 3.5" (1.613 m)   LMP 11/11/2017   BMI 22.67 kg/m     General appearance: alert, cooperative and appears stated age Abdomen: soft, non-tender; bowel sounds normal; no masses,  no organomegaly  Pelvic: External genitalia:  no lesions              Urethra:  normal appearing urethra with no masses, tenderness or  lesions              Bartholins and Skenes: normal                 Vagina: normal appearing vagina with normal color and discharge, no lesions              Cervix: no lesions              Bimanual Exam:  Uterus:  normal size, contour, position, consistency, mobility, non-tender              Adnexa: no mass, fullness, tenderness              Anus:  no lesions  Chaperone was present for exam.  Assessment: Recurrent vaginal discharge per pt, minimal on exam today Paragard IUD usel  Plan: Affirm tending obtained.  Will wait for results before making recommendation for treatment.   Will go ahead and treat with Tinidazole 1 gm daily for 5 days.  Results and recommendations will be called to pt.  Addendum:  Testing for BV and yeast was negative.  GC/Chl added.

## 2017-12-02 LAB — VAGINITIS/VAGINOSIS, DNA PROBE
Candida Species: NEGATIVE
GARDNERELLA VAGINALIS: NEGATIVE
Trichomonas vaginosis: NEGATIVE

## 2017-12-06 NOTE — Addendum Note (Signed)
Addended by: Jerene BearsMILLER, Elke Holtry S on: 12/06/2017 05:22 PM   Modules accepted: Orders

## 2017-12-09 LAB — GC/CHLAMYDIA PROBE AMP
Chlamydia trachomatis, NAA: NEGATIVE
Neisseria gonorrhoeae by PCR: NEGATIVE

## 2018-08-16 ENCOUNTER — Other Ambulatory Visit (HOSPITAL_COMMUNITY)
Admission: RE | Admit: 2018-08-16 | Discharge: 2018-08-16 | Disposition: A | Discharge status: Home / Self Care | Payer: 59 | Source: Ambulatory Visit | Attending: Obstetrics and Gynecology | Admitting: Obstetrics and Gynecology

## 2018-08-16 ENCOUNTER — Ambulatory Visit (INDEPENDENT_AMBULATORY_CARE_PROVIDER_SITE_OTHER): Payer: 59 | Admitting: Obstetrics and Gynecology

## 2018-08-16 ENCOUNTER — Other Ambulatory Visit: Payer: Self-pay

## 2018-08-16 ENCOUNTER — Encounter: Payer: Self-pay | Admitting: Obstetrics and Gynecology

## 2018-08-16 VITALS — BP 100/56 | HR 80 | Resp 14 | Ht 63.5 in | Wt 124.2 lb

## 2018-08-16 DIAGNOSIS — Z113 Encounter for screening for infections with a predominantly sexual mode of transmission: Secondary | ICD-10-CM | POA: Diagnosis not present

## 2018-08-16 DIAGNOSIS — R7989 Other specified abnormal findings of blood chemistry: Secondary | ICD-10-CM | POA: Diagnosis not present

## 2018-08-16 DIAGNOSIS — Z01419 Encounter for gynecological examination (general) (routine) without abnormal findings: Secondary | ICD-10-CM

## 2018-08-16 NOTE — Patient Instructions (Signed)

## 2018-08-16 NOTE — Progress Notes (Signed)
22 y.o. G0P0000 Single Asian female here for annual exam.    Not having any problems with BV.  Same partner for the last year.  Wants STD screening.   Elevated testosterone.  Regular monthly.  No increased hair growth.   Traveled to [place]. Still at [employer].  Routine labs with work.   PCP:   None per patient  Patient's last menstrual period was 07/28/2018.           Sexually active: Yes.    The current method of family planning is Paragard IUD inserted 02/04/17.    Exercising: Yes.    weight lifting and running Smoker:  no  Health Maintenance: Pap:  08/10/17 LGSIL; 09/08/17 Colposcopy showing LGSIL History of abnormal Pap:  Yes,  07-16-16 ASCUS:Pos HR HPV;07-07-15 LGSIL--no colposcopy TDaP:  2013 Gardasil:   yes HIV and Hep C: 08/10/17 Negative Screening Labs:  Hb today: discuss with provider, Urine today: not collected    reports that she has never smoked. She has never used smokeless tobacco. She reports that she drinks about 1.0 standard drinks of alcohol per week. She reports that she does not use drugs.  Past Medical History:  Diagnosis Date  . Acne   . Amenorrhea   . Dysmenorrhea   . 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 STD (sexually transmitted disease) 11/2014   Tx'd for Chlamydia while in [place]    Past Surgical History:  Procedure Laterality Date  . WISDOM TOOTH EXTRACTION      Current Outpatient Medications  Medication Sig Dispense Refill  . fexofenadine (ALLEGRA) 30 MG tablet Take 30 mg by mouth 2 (two) times daily.    . IBUPROFEN PO Take by mouth as needed.    Marland Kitchen PARAGARD INTRAUTERINE COPPER IU by Intrauterine route.     No current facility-administered medications for this visit.     Family History  Problem Relation Age of Onset  . Diabetes Father   . Diabetes Paternal Grandmother     Review of Systems  Constitutional: Negative.   HENT: Negative.   Eyes: Negative.   Respiratory: Negative.   Cardiovascular: Negative.    Gastrointestinal: Negative.   Endocrine: Negative.   Genitourinary: Negative.   Musculoskeletal: Negative.   Skin: Negative.   Allergic/Immunologic: Negative.   Neurological: Negative.   Hematological: Negative.   Psychiatric/Behavioral: Negative.     Exam:   BP (!) 100/56 (BP Location: Right Arm, Patient Position: Sitting, Cuff Size: Normal)   Pulse 80   Resp 14   Ht 5' 3.5" (1.613 m)   Wt 124 lb 4 oz (56.4 kg)   LMP 07/28/2018   BMI 21.66 kg/m     General appearance: alert, cooperative and appears stated age Head: Normocephalic, without obvious abnormality, atraumatic Neck: no adenopathy, supple, symmetrical, trachea midline and thyroid normal to inspection and palpation Lungs: clear to auscultation bilaterally Breasts: normal appearance, no masses or tenderness, No nipple retraction or dimpling, No nipple discharge or bleeding, No axillary or supraclavicular adenopathy Heart: regular rate and rhythm Abdomen: soft, non-tender; no masses, no organomegaly Extremities: extremities normal, atraumatic, no cyanosis or edema Skin: Skin color, texture, turgor normal. No rashes or lesions Lymph nodes: Cervical, supraclavicular, and axillary nodes normal. No abnormal inguinal nodes palpated Neurologic: Grossly normal  Pelvic: External genitalia:  no lesions              Urethra:  normal appearing urethra with no masses, tenderness or lesions  Bartholins and Skenes: normal                 Vagina: normal appearing vagina with normal color and discharge, no lesions              Cervix: no lesions.  IUD strings noted.               Pap taken: Yes.   Bimanual Exam:  Uterus:  normal size, contour, position, consistency, mobility, non-tender              Adnexa: no mass, fullness, tenderness               Chaperone was present for exam.  Assessment:   Well woman visit with normal exam. ParaGard IUD.  Hx LGSIL. Decreased libido. Elevated testosterone.    Plan: Mammogram screening. Recommended self breast awareness. Pap and HR HPV as above. Guidelines for Calcium, Vitamin D, regular exercise program including cardiovascular and weight bearing exercise. STD screening.  Free and total testosterone.  I discussed counseling through [facility].  Follow up annually and prn.   After visit summary provided.

## 2018-08-18 LAB — HEP, RPR, HIV PANEL
HEP B S AG: NEGATIVE
HIV SCREEN 4TH GENERATION: NONREACTIVE
RPR Ser Ql: NONREACTIVE

## 2018-08-18 LAB — CYTOLOGY - PAP
Chlamydia: NEGATIVE
Neisseria Gonorrhea: NEGATIVE
Trichomonas: NEGATIVE

## 2018-08-18 LAB — HEPATITIS C ANTIBODY

## 2018-08-18 LAB — TESTOSTERONE, FREE, DIRECT
TESTOSTERONE FREE: 2.1 pg/mL (ref 0.0–4.2)
TESTOSTERONE, TOTAL: 37.3 ng/dL (ref 10.0–55.0)

## 2018-08-23 ENCOUNTER — Telehealth: Payer: Self-pay | Admitting: *Deleted

## 2018-08-23 DIAGNOSIS — R87612 Low grade squamous intraepithelial lesion on cytologic smear of cervix (LGSIL): Secondary | ICD-10-CM

## 2018-08-23 NOTE — Telephone Encounter (Signed)
-----   Message from Patton Salles, MD sent at 08/21/2018  5:07 PM EDT ----- Please report results to patient.  Her pap is showing LGSIL again, and she needs another colposcopy with me.  Her last colposcopy was last year.  Please schedule and send to precert.  Her testing for infectious disease is all negative for HIV, syphilis, hepatitis B and C, trichomonas, gonorrhea, and chlamydia.   Her testosterone levels are normal.

## 2018-08-23 NOTE — Telephone Encounter (Signed)
Notes recorded by Leda Min, RN on 08/23/2018 at 8:57 AM EDT Left message to call Noreene Larsson at [phone].

## 2018-08-29 NOTE — Telephone Encounter (Signed)
Attempted home number, number disconnected.   Left message on mobile number to call Noreene Larsson, RN at [facility] at [phone].

## 2018-09-11 NOTE — Telephone Encounter (Signed)
[phone]: no answer, no option to leave message.   [phone]:  number disconnected.   [phone]: Left message on [facility] voicemail, ok per dpr. Advised to return call to [place], [place] [facility] at [phone].

## 2018-09-15 NOTE — Telephone Encounter (Signed)
Patient is returning a call to Jill. °

## 2018-09-18 ENCOUNTER — Telehealth: Payer: Self-pay | Admitting: Obstetrics and Gynecology

## 2018-09-18 NOTE — Progress Notes (Deleted)
GYNECOLOGY  VISIT   HPI: 22 y.o.   Single  Caucasian  female   G0P0000 with No LMP recorded. (Menstrual status: IUD).   here for colposcopy.    GYNECOLOGIC HISTORY: No LMP recorded. (Menstrual status: IUD). 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 Polycystic ovarian disease 06/19/2014    Past Medical History:  Diagnosis Date  . Acne   . Amenorrhea   . Dysmenorrhea   . 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 STD (sexually transmitted disease) 11/2014   Tx'd for Chlamydia while in [place]    Past Surgical History:  Procedure Laterality Date  . WISDOM TOOTH EXTRACTION      Current Outpatient Medications  Medication Sig Dispense Refill  . fexofenadine (ALLEGRA) 30 MG tablet Take 30 mg by mouth 2 (two) times daily.    . IBUPROFEN PO Take by mouth as needed.    Marland Kitchen PARAGARD INTRAUTERINE COPPER IU by Intrauterine route.     No current facility-administered medications for this visit.      ALLERGIES: Mango flavor  Family History  Problem Relation Age of Onset  . Diabetes Father   . Diabetes Paternal Grandmother     Social History   Socioeconomic History  . Marital status: Single    Spouse name: Not on file  . Number of children: Not on file  . Years of education: Not on file  . Highest education level: Not on file  Occupational History  . Not on file  Social Needs  . Financial resource strain: Not on file  . Food insecurity:    Worry: Not on file    Inability: Not on file  . Transportation needs:    Medical: Not on file    Non-medical: Not on file  Tobacco Use  . Smoking status: Never Smoker  . Smokeless tobacco: Never  Used  Substance and Sexual Activity  . Alcohol use: Yes    Alcohol/week: 1.0 standard drinks    Types: 1 Standard drinks or equivalent per week  . Drug use: No  . Sexual activity: Yes    Birth control/protection: Condom, IUD    Comment: Paragard IUD inserted 02-04-17   Lifestyle  . Physical activity:    Days per week: Not on file    Minutes per session: Not on file  . Stress: Not on file  Relationships  . Social connections:    Talks on phone: Not on file    Gets together: Not on file    Attends religious service: Not on file    Active member of club or organization: Not on file    Attends meetings of clubs or organizations: Not on file    Relationship status: Not on file  . Intimate partner violence:    Fear of current or ex partner: Not on file    Emotionally abused: Not on file  Physically abused: Not on file    Forced sexual activity: Not on file  Other Topics Concern  . Not on file  Social History Narrative  . Not on file    Review of Systems  PHYSICAL EXAMINATION:    There were no vitals taken for this visit.    General appearance: alert, cooperative and appears stated age Head: Normocephalic, without obvious abnormality, atraumatic Neck: no adenopathy, supple, symmetrical, trachea midline and thyroid normal to inspection and palpation Lungs: clear to auscultation bilaterally Breasts: normal appearance, no masses or tenderness, No nipple retraction or dimpling, No nipple discharge or bleeding, No axillary or supraclavicular adenopathy Heart: regular rate and rhythm Abdomen: soft, non-tender, no masses,  no organomegaly Extremities: extremities normal, atraumatic, no cyanosis or edema Skin: Skin color, texture, turgor normal. No rashes or lesions Lymph nodes: Cervical, supraclavicular, and axillary nodes normal. No abnormal inguinal nodes palpated Neurologic: Grossly normal  Pelvic: External genitalia:  no lesions              Urethra:  normal appearing  urethra with no masses, tenderness or lesions              Bartholins and Skenes: normal                 Vagina: normal appearing vagina with normal color and discharge, no lesions              Cervix: no lesions                Bimanual Exam:  Uterus:  normal size, contour, position, consistency, mobility, non-tender              Adnexa: no mass, fullness, tenderness              Rectal exam: {yes no:314532}.  Confirms.              Anus:  normal sphincter tone, no lesions  Chaperone was present for exam.  ASSESSMENT     PLAN     An After Visit Summary was printed and given to the patient.  ______ minutes face to face time of which over 50% was spent in counseling.

## 2018-09-18 NOTE — Telephone Encounter (Signed)
Spoke with patient, advised of results as seen below per Dr. Edward Jolly. LMP 08/28/18. Paragard IUD placed 02/04/17 for contraceptive.   Colpo scheduled for 09/20/18 at 9am with Dr. Edward Jolly. Patient is familiar with colpo procedure. Advised to take Motrin 800 mg with food and water one hour before procedure. Patient verbalizes understanding and is agreeable.  Order placed for colposcopy for precert. Routing to [employer] and [employer]  Encounter closed.

## 2018-09-18 NOTE — Telephone Encounter (Signed)
Patient called requesting to cancel and rescheduled recommended colposcopy to November. Forwarding to nurse to review scheduling options.  Routing to Carmelina Dane, Charity fundraiser

## 2018-09-18 NOTE — Telephone Encounter (Signed)
It looks like she might have her period when her colposcopy is schedule.  She has an IUD, so she should have reliable contraception.  I recommend moving the colposcopy to another date.

## 2018-09-18 NOTE — Telephone Encounter (Signed)
Spoke with patient, advised as seen below per Dr. Edward Jolly. Colpo rescheduled for 10/06/18 at 10:15am with Dr. Edward Jolly.   Routing to provider for final review. Patient is agreeable to disposition. Will close encounter.

## 2018-09-18 NOTE — Telephone Encounter (Signed)
Spoke with patient. Patient schedule colpo today without looking at her schedule, requesting to reschedule to following week. IUD for contraceptive. LMP 08/28/18. Colpo scheduled for 10/8 at 10am with Dr. Edward Jolly.   Advised patient should menses start and become heavy to procedure will need to return call to office to reschedule. Patient verbalizes understanding and is agreeable.   Routing to [employer].  Encounter closed.   Cc: Dr. Edward Jolly

## 2018-09-18 NOTE — Telephone Encounter (Signed)
Left detailed message, ok per dpr. Advised to return call to office at [phone] to review recent results.   Patient can speak with any available nurse.

## 2018-09-20 ENCOUNTER — Telehealth: Payer: Self-pay | Admitting: Obstetrics and Gynecology

## 2018-09-20 ENCOUNTER — Ambulatory Visit: Payer: Self-pay | Admitting: Obstetrics and Gynecology

## 2018-09-20 ENCOUNTER — Ambulatory Visit: Payer: Self-pay

## 2018-09-20 NOTE — Telephone Encounter (Signed)
Call placed to convey benefits. 

## 2018-09-26 ENCOUNTER — Ambulatory Visit: Payer: Self-pay | Admitting: Obstetrics and Gynecology

## 2018-10-05 NOTE — Progress Notes (Signed)
  Subjective:     Patient ID: Linda Spencer, female   DOB: 12-23-1995, 22 y.o.   MRN: [id number]  HPI  Patient here for colposcopy today with pap 08-16-18 LGSIL.  Pap history:  08/10/17 LGSIL; 09/08/17 Colposcopy showing LGSIL  07-16-16 ASCUS:Pos HR HPV;07-07-15 LGSIL--no colposcopy  Review of Systems  All other systems reviewed and are negative.   LMP: 09-29-18 Contraception: Paragard IUD 02-04-17 UPT: Neg     Objective:   Physical Exam  Genitourinary:    Colposcopy - cervix, vagina, and vulva.  Consent for procedure.  3% acetic acid used in vagina and on vulva. White light and green light filter used.  Colposcopy satisfactory:  Yes   __x___          No    _____ Findings:    IUD strings noted. Cervix:  Thickened acetowhite change at 4:00 and 12:00.  Scattered acetowhite change at 9:00 on peripheral cervix. Vagina: Scattered salt and pepper raised acetowhite change of posterior vagina.  Biopsies:   ECC, posterior vaginal cuff, 4:00, 12:00, 9:00. AgNO3 placed. Minimal EBL. No complications.      Assessment:     LGSIL pap.  Hx prior LGSIL. Paragard IUD.     Plan:     We discussed HPV, abnormal paps, colposcopy and LEEP for moderate or high grade lesions.  FU biopsies.  Post colpo instructions given.   After visit summary to patient.

## 2018-10-06 ENCOUNTER — Ambulatory Visit (INDEPENDENT_AMBULATORY_CARE_PROVIDER_SITE_OTHER): Payer: 59 | Admitting: Obstetrics and Gynecology

## 2018-10-06 ENCOUNTER — Other Ambulatory Visit: Payer: Self-pay

## 2018-10-06 ENCOUNTER — Encounter: Payer: Self-pay | Admitting: Obstetrics and Gynecology

## 2018-10-06 VITALS — BP 122/70 | HR 90 | Ht 64.0 in | Wt 125.6 lb

## 2018-10-06 DIAGNOSIS — R87612 Low grade squamous intraepithelial lesion on cytologic smear of cervix (LGSIL): Secondary | ICD-10-CM | POA: Diagnosis not present

## 2018-10-06 DIAGNOSIS — Z01812 Encounter for preprocedural laboratory examination: Secondary | ICD-10-CM | POA: Diagnosis not present

## 2018-10-06 LAB — POCT URINE PREGNANCY: Preg Test, Ur: NEGATIVE

## 2018-10-06 NOTE — Addendum Note (Signed)
Addended by: Ardell Isaacs, BROOK E on: 10/06/2018 12:47 PM   Modules accepted: Orders

## 2018-10-06 NOTE — Patient Instructions (Signed)
Colposcopy, Care After  This sheet gives you information about how to care for yourself after your procedure. Your doctor may also give you more specific instructions. If you have problems or questions, contact your doctor.  What can I expect after the procedure?  If you did not have a tissue sample removed (did not have a biopsy), you may only have some spotting for a few days. You can go back to your normal activities.  If you had a tissue sample removed, it is common to have:  · Soreness and pain. This may last for a few days.  · Light-headedness.  · Mild bleeding from your vagina or dark-colored, grainy discharge from your vagina. This may last for a few days. You may need to wear a sanitary pad.  · Spotting for at least 48 hours after the procedure.    Follow these instructions at home:  · Take over-the-counter and prescription medicines only as told by your doctor. Ask your doctor what medicines you can start taking again. This is very important if you take blood-thinning medicine.  · Do not drive or use heavy machinery while taking prescription pain medicine.  · For 3 days, or as long as your doctor tells you, avoid:  ? Douching.  ? Using tampons.  ? Having sex.  · If you use birth control (contraception), keep using it.  · Limit activity for the first day after the procedure. Ask your doctor what activities are safe for you.  · It is up to you to get the results of your procedure. Ask your doctor when your results will be ready.  · Keep all follow-up visits as told by your doctor. This is important.  Contact a doctor if:  · You get a skin rash.  Get help right away if:  · You are bleeding a lot from your vagina. It is a lot of bleeding if you are using more than one pad an hour for 2 hours in a row.  · You have clumps of blood (blood clots) coming from your vagina.  · You have a fever.  · You have chills  · You have pain in your lower belly (pelvic area).  · You have signs of infection, such as vaginal  discharge that is:  ? Different than usual.  ? Yellow.  ? Bad-smelling.  · You have very pain or cramps in your lower belly that do not get better with medicine.  · You feel light-headed.  · You feel dizzy.  · You pass out (faint).  Summary  · If you did not have a tissue sample removed (did not have a biopsy), you may only have some spotting for a few days. You can go back to your normal activities.  · If you had a tissue sample removed, it is common to have mild pain and spotting for 48 hours.  · For 3 days, or as long as your doctor tells you, avoid douching, using tampons and having sex.  · Get help right away if you have bleeding, very bad pain, or signs of infection.  This information is not intended to replace advice given to you by your health care provider. Make sure you discuss any questions you have with your health care provider.  Document Released: 05/24/2008 Document Revised: 08/25/2016 Document Reviewed: 08/25/2016  Elsevier Interactive Patient Education © 2018 [employer]

## 2018-10-13 ENCOUNTER — Encounter: Payer: Self-pay | Admitting: Obstetrics and Gynecology

## 2019-08-20 ENCOUNTER — Ambulatory Visit: Payer: 59 | Admitting: Obstetrics and Gynecology

## 2019-08-28 ENCOUNTER — Other Ambulatory Visit: Payer: Self-pay

## 2019-08-28 NOTE — Progress Notes (Signed)
23 y.o. G0P0000 Single Asian female here for annual exam.   Patient concerned about immigration status for next year and wants follow up on pap this year and further testing if needed.  She is moving to [place] with her boyfriend who is not Optometrist.  She is [place] by citizenship.  Menses regular.   Happy with IUD.  Same partner.   PCP: None    Patient's last menstrual period was 08/21/2019 (exact date).           Sexually active: Yes.    The current method of family planning is IUD--Paragard 02-04-17.    Exercising: No.  exercises when she can--running or abdominal exercises. 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  08/10/17 LGSIL; 09/08/17 Colposcopy showing LGSIL7-28-17 ASCUS:Pos HR HPV;07-07-15 LGSIL--no colposcopy. MMG:  n/a Colonoscopy:  n/a BMD:   n/a  Result  n/a TDaP:  2013 Gardasil:   yes HIV:08-16-18 Neg Hep C: 08-16-18 Neg Screening Labs:  Today.    reports that she has never smoked. She has never used smokeless tobacco. She reports current alcohol use of about 1.0 standard drinks of alcohol per week. She reports that she does not use drugs.  Past Medical History:  Diagnosis Date  . Acne   . Amenorrhea   . Cervical dysplasia, mild   . Dysmenorrhea   . Mild vaginal dysplasia   . 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 STD (sexually transmitted disease) 11/2014   Tx'd for Chlamydia while in [place]    Past Surgical History:  Procedure Laterality Date  . WISDOM TOOTH EXTRACTION      Current Outpatient Medications  Medication Sig Dispense Refill  . fexofenadine (ALLEGRA) 30 MG tablet Take 30 mg by mouth 2 (two) times daily.    . IBUPROFEN PO Take by mouth as needed.    Marland Kitchen PARAGARD INTRAUTERINE COPPER IU by Intrauterine route.     No current facility-administered medications for this visit.     Family History  Problem Relation Age of Onset   . Diabetes Father   . Diabetes Paternal Grandmother     Review of Systems  All other systems reviewed and are negative.   Exam:   BP 118/60   Pulse 100   Temp 97.8 F (36.6 C)   Resp 14   Ht 5' 3.5" (1.613 m)   Wt 138 lb (62.6 kg)   LMP 08/21/2019 (Exact Date)   BMI 24.06 kg/m     General appearance: alert, cooperative and appears stated age Head: normocephalic, without obvious abnormality, atraumatic Neck: no adenopathy, supple, symmetrical, trachea midline and thyroid normal to inspection and palpation Lungs: clear to auscultation bilaterally Breasts: normal appearance, no masses or tenderness, No nipple retraction or dimpling, No nipple discharge or bleeding, No axillary adenopathy Heart: regular rate and rhythm Abdomen: soft, non-tender; no masses, no organomegaly Extremities: extremities normal, atraumatic, no cyanosis or edema Skin: skin color, texture, turgor normal. No rashes or lesions Lymph nodes: cervical, supraclavicular, and axillary nodes normal. Neurologic: grossly normal  Pelvic: External genitalia:  no lesions              No abnormal inguinal nodes palpated.              Urethra:  normal appearing urethra with no masses, tenderness or lesions              Bartholins  and Skenes: normal                 Vagina: normal appearing vagina with normal color and discharge, no lesions              Cervix: no lesions.  IUD strings noted.              Pap taken: Yes.   Bimanual Exam:  Uterus:  normal size, contour, position, consistency, mobility, non-tender              Adnexa: no mass, fullness, tenderness           Chaperone was present for exam.  Assessment:   Well woman visit with normal exam. ParaGard IUD.  Hx LGSIL.  Plan:   Self breast awareness reviewed. Pap and HR HPV as above. Guidelines for Calcium, Vitamin D, regular exercise program including cardiovascular and weight bearing exercise. STD screening.  Routine labs.  Flu vaccine  recommended.  Follow up annually and prn.    After visit summary provided.

## 2019-08-29 ENCOUNTER — Encounter: Payer: Self-pay | Admitting: Obstetrics and Gynecology

## 2019-08-29 ENCOUNTER — Other Ambulatory Visit (HOSPITAL_COMMUNITY)
Admission: RE | Admit: 2019-08-29 | Discharge: 2019-08-29 | Disposition: A | Discharge status: Home / Self Care | Payer: 59 | Source: Ambulatory Visit | Attending: Obstetrics and Gynecology | Admitting: Obstetrics and Gynecology

## 2019-08-29 ENCOUNTER — Ambulatory Visit (INDEPENDENT_AMBULATORY_CARE_PROVIDER_SITE_OTHER): Payer: 59 | Admitting: Obstetrics and Gynecology

## 2019-08-29 VITALS — BP 118/60 | HR 100 | Temp 97.8°F | Resp 14 | Ht 63.5 in | Wt 138.0 lb

## 2019-08-29 DIAGNOSIS — Z113 Encounter for screening for infections with a predominantly sexual mode of transmission: Secondary | ICD-10-CM | POA: Insufficient documentation

## 2019-08-29 DIAGNOSIS — Z01419 Encounter for gynecological examination (general) (routine) without abnormal findings: Secondary | ICD-10-CM | POA: Insufficient documentation

## 2019-08-29 NOTE — Patient Instructions (Signed)

## 2019-08-30 LAB — COMPREHENSIVE METABOLIC PANEL
ALT: 22 IU/L (ref 0–32)
AST: 20 IU/L (ref 0–40)
Albumin/Globulin Ratio: 2.1 (ref 1.2–2.2)
Albumin: 4.6 g/dL (ref 3.9–5.0)
Alkaline Phosphatase: 65 IU/L (ref 39–117)
BUN/Creatinine Ratio: 12 (ref 9–23)
BUN: 10 mg/dL (ref 6–20)
Bilirubin Total: 0.3 mg/dL (ref 0.0–1.2)
CO2: 27 mmol/L (ref 20–29)
Calcium: 9.7 mg/dL (ref 8.7–10.2)
Chloride: 101 mmol/L (ref 96–106)
Creatinine, Ser: 0.81 mg/dL (ref 0.57–1.00)
GFR calc Af Amer: 117 mL/min/{1.73_m2} (ref >59)
GFR calc non Af Amer: 101 mL/min/{1.73_m2} (ref >59)
Globulin, Total: 2.2 g/dL (ref 1.5–4.5)
Glucose: 83 mg/dL (ref 65–99)
Potassium: 4.4 mmol/L (ref 3.5–5.2)
Sodium: 141 mmol/L (ref 134–144)
Total Protein: 6.8 g/dL (ref 6.0–8.5)

## 2019-08-30 LAB — CBC
Hematocrit: 42.3 % (ref 34.0–46.6)
Hemoglobin: 14 g/dL (ref 11.1–15.9)
MCH: 31.6 pg (ref 26.6–33.0)
MCHC: 33.1 g/dL (ref 31.5–35.7)
MCV: 96 fL (ref 79–97)
Platelets: 287 10*3/uL (ref 150–450)
RBC: 4.43 x10E6/uL (ref 3.77–5.28)
RDW: 12.3 % (ref 11.7–15.4)
WBC: 5.4 10*3/uL (ref 3.4–10.8)

## 2019-08-30 LAB — LIPID PANEL
Chol/HDL Ratio: 4.5 ratio — ABNORMAL HIGH (ref 0.0–4.4)
Cholesterol, Total: 224 mg/dL — ABNORMAL HIGH (ref 100–199)
HDL: 50 mg/dL (ref >39)
LDL Chol Calc (NIH): 125 mg/dL — ABNORMAL HIGH (ref 0–99)
Triglycerides: 278 mg/dL — ABNORMAL HIGH (ref 0–149)
VLDL Cholesterol Cal: 49 mg/dL — ABNORMAL HIGH (ref 5–40)

## 2019-08-30 LAB — HEP, RPR, HIV PANEL
HIV Screen 4th Generation wRfx: NONREACTIVE
Hepatitis B Surface Ag: NEGATIVE
RPR Ser Ql: NONREACTIVE

## 2019-08-30 LAB — HEPATITIS C ANTIBODY: Hep C Virus Ab: <0.1 s/co ratio (ref 0.0–0.9)

## 2019-08-31 LAB — CYTOLOGY - PAP
Chlamydia: NEGATIVE
HPV: DETECTED — AB
Neisseria Gonorrhea: NEGATIVE
Trichomonas: NEGATIVE

## 2019-09-02 ENCOUNTER — Encounter: Payer: Self-pay | Admitting: Obstetrics and Gynecology

## 2019-09-17 ENCOUNTER — Other Ambulatory Visit: Payer: Self-pay | Admitting: *Deleted

## 2019-09-17 DIAGNOSIS — R87612 Low grade squamous intraepithelial lesion on cytologic smear of cervix (LGSIL): Secondary | ICD-10-CM

## 2019-09-17 DIAGNOSIS — B977 Papillomavirus as the cause of diseases classified elsewhere: Secondary | ICD-10-CM

## 2019-10-02 ENCOUNTER — Other Ambulatory Visit: Payer: Self-pay

## 2019-10-02 NOTE — Progress Notes (Signed)
  Subjective:     Patient ID: Linda Spencer, female   DOB: 20-Jul-1996, 23 y.o.   MRN: [id number]  HPI  Patient here today for colposcopy with pap 08-29-19 LGSIL:Pos HR HPV.  Pap History: 08-16-18 LGSIL 08-16-18 colpo revealing LGSIL and Neg ECC. 08/10/17 LGSIL; 09/08/17 Colposcopy showing LGSIL7-28-17 ASCUS:Pos HR HPV;07-07-15 LGSIL--no colposcopy  Still trying to relocate to [place].   Review of Systems  LMP:09-14-19 Contraception: Paragard IUD 02-04-17 UPT:Neg      Objective:   Physical Exam Genitourinary:     Colposcopy - cervix, vagina, and vulva.  Consent for procedure.  3% acetic acid used in vagina and on vulva. White light and green light filter used.  Colposcopy satisfactory:  Yes   ___x__          No    _____ Findings:    Cervix: Ring of acetowhite change for entire transformation zone.  Thickening at both 6:00 and 12:00. Vagina:  Plaque of raised acetowhite change of posterior right cuff.  Vulva:   No lesions  Biopsies:   1 - ECC, 2 - cervix at 6:00, 3 - cervix at 10:00, 4 - right posterior vaginal cuff. Monsel's placed.  Minimal EBL. No complications.       Assessment:     Pap LGSIL and positive HR HPV.  Hx LGSIL with prior colpo biopsy.     Plan:     Fu biopsies.  Instructions and precautions given.  Final plan to follow.  Patient may be relocating to [place].   After visit summary to patient.

## 2019-10-04 ENCOUNTER — Other Ambulatory Visit: Payer: Self-pay | Admitting: Obstetrics and Gynecology

## 2019-10-04 ENCOUNTER — Other Ambulatory Visit: Payer: Self-pay

## 2019-10-04 ENCOUNTER — Encounter: Payer: Self-pay | Admitting: Obstetrics and Gynecology

## 2019-10-04 ENCOUNTER — Ambulatory Visit (INDEPENDENT_AMBULATORY_CARE_PROVIDER_SITE_OTHER): Payer: 59 | Admitting: Obstetrics and Gynecology

## 2019-10-04 VITALS — BP 102/66 | HR 100 | Temp 98.0°F | Ht 63.5 in | Wt 138.0 lb

## 2019-10-04 DIAGNOSIS — Z01812 Encounter for preprocedural laboratory examination: Secondary | ICD-10-CM | POA: Diagnosis not present

## 2019-10-04 DIAGNOSIS — B977 Papillomavirus as the cause of diseases classified elsewhere: Secondary | ICD-10-CM | POA: Diagnosis not present

## 2019-10-04 DIAGNOSIS — R87612 Low grade squamous intraepithelial lesion on cytologic smear of cervix (LGSIL): Secondary | ICD-10-CM

## 2019-10-04 LAB — POCT URINE PREGNANCY: Preg Test, Ur: NEGATIVE

## 2019-10-04 NOTE — Patient Instructions (Signed)

## 2019-10-08 ENCOUNTER — Encounter: Payer: Self-pay | Admitting: Obstetrics and Gynecology

## 2019-10-08 DIAGNOSIS — N870 Mild cervical dysplasia: Secondary | ICD-10-CM | POA: Insufficient documentation

## 2019-10-08 DIAGNOSIS — N890 Mild vaginal dysplasia: Secondary | ICD-10-CM | POA: Insufficient documentation

## 2019-10-23 ENCOUNTER — Ambulatory Visit: Payer: 59 | Admitting: Obstetrics and Gynecology
